# Patient Record
Sex: Male | Born: 1965 | Race: White | Hispanic: No | Smoking: Former smoker
Health system: Southern US, Community
[De-identification: ages and names within clinical notes are randomized; demographics above are authoritative.]

## PROBLEM LIST (undated history)

## (undated) DIAGNOSIS — R569 Unspecified convulsions: Secondary | ICD-10-CM

## (undated) DIAGNOSIS — I499 Cardiac arrhythmia, unspecified: Secondary | ICD-10-CM

## (undated) DIAGNOSIS — I10 Essential (primary) hypertension: Secondary | ICD-10-CM

## (undated) DIAGNOSIS — N400 Enlarged prostate without lower urinary tract symptoms: Secondary | ICD-10-CM

## (undated) DIAGNOSIS — M1990 Unspecified osteoarthritis, unspecified site: Secondary | ICD-10-CM

## (undated) DIAGNOSIS — E785 Hyperlipidemia, unspecified: Secondary | ICD-10-CM

## (undated) DIAGNOSIS — R0600 Dyspnea, unspecified: Secondary | ICD-10-CM

## (undated) DIAGNOSIS — K219 Gastro-esophageal reflux disease without esophagitis: Secondary | ICD-10-CM

## (undated) HISTORY — DX: Gastro-esophageal reflux disease without esophagitis: K21.9

## (undated) HISTORY — PX: CHOLECYSTECTOMY: SHX55

## (undated) HISTORY — DX: Unspecified convulsions: R56.9

## (undated) HISTORY — DX: Hyperlipidemia, unspecified: E78.5

## (undated) HISTORY — DX: Cardiac arrhythmia, unspecified: I49.9

## (undated) HISTORY — DX: Benign prostatic hyperplasia without lower urinary tract symptoms: N40.0

---

## 2010-11-26 DIAGNOSIS — R55 Syncope and collapse: Secondary | ICD-10-CM

## 2015-07-08 DIAGNOSIS — K210 Gastro-esophageal reflux disease with esophagitis: Secondary | ICD-10-CM | POA: Diagnosis not present

## 2015-07-08 DIAGNOSIS — G40111 Localization-related (focal) (partial) symptomatic epilepsy and epileptic syndromes with simple partial seizures, intractable, with status epilepticus: Secondary | ICD-10-CM | POA: Diagnosis not present

## 2015-07-08 DIAGNOSIS — I10 Essential (primary) hypertension: Secondary | ICD-10-CM | POA: Diagnosis not present

## 2015-07-08 DIAGNOSIS — Z6823 Body mass index (BMI) 23.0-23.9, adult: Secondary | ICD-10-CM | POA: Diagnosis not present

## 2015-07-09 DIAGNOSIS — I10 Essential (primary) hypertension: Secondary | ICD-10-CM | POA: Diagnosis not present

## 2015-07-09 DIAGNOSIS — Z125 Encounter for screening for malignant neoplasm of prostate: Secondary | ICD-10-CM | POA: Diagnosis not present

## 2015-10-06 DIAGNOSIS — Z1389 Encounter for screening for other disorder: Secondary | ICD-10-CM | POA: Diagnosis not present

## 2015-10-06 DIAGNOSIS — I10 Essential (primary) hypertension: Secondary | ICD-10-CM | POA: Diagnosis not present

## 2015-10-06 DIAGNOSIS — K210 Gastro-esophageal reflux disease with esophagitis: Secondary | ICD-10-CM | POA: Diagnosis not present

## 2015-10-06 DIAGNOSIS — G40111 Localization-related (focal) (partial) symptomatic epilepsy and epileptic syndromes with simple partial seizures, intractable, with status epilepticus: Secondary | ICD-10-CM | POA: Diagnosis not present

## 2015-10-06 DIAGNOSIS — N4289 Other specified disorders of prostate: Secondary | ICD-10-CM | POA: Diagnosis not present

## 2015-10-06 DIAGNOSIS — Z6822 Body mass index (BMI) 22.0-22.9, adult: Secondary | ICD-10-CM | POA: Diagnosis not present

## 2015-10-06 DIAGNOSIS — Z0000 Encounter for general adult medical examination without abnormal findings: Secondary | ICD-10-CM | POA: Diagnosis not present

## 2015-10-06 DIAGNOSIS — Z131 Encounter for screening for diabetes mellitus: Secondary | ICD-10-CM | POA: Diagnosis not present

## 2016-01-07 DIAGNOSIS — I10 Essential (primary) hypertension: Secondary | ICD-10-CM | POA: Diagnosis not present

## 2016-01-07 DIAGNOSIS — K210 Gastro-esophageal reflux disease with esophagitis: Secondary | ICD-10-CM | POA: Diagnosis not present

## 2016-01-07 DIAGNOSIS — Z6821 Body mass index (BMI) 21.0-21.9, adult: Secondary | ICD-10-CM | POA: Diagnosis not present

## 2016-01-07 DIAGNOSIS — Z0000 Encounter for general adult medical examination without abnormal findings: Secondary | ICD-10-CM | POA: Diagnosis not present

## 2016-04-09 DIAGNOSIS — I10 Essential (primary) hypertension: Secondary | ICD-10-CM | POA: Diagnosis not present

## 2016-04-09 DIAGNOSIS — Z6822 Body mass index (BMI) 22.0-22.9, adult: Secondary | ICD-10-CM | POA: Diagnosis not present

## 2016-04-09 DIAGNOSIS — K210 Gastro-esophageal reflux disease with esophagitis: Secondary | ICD-10-CM | POA: Diagnosis not present

## 2016-07-09 DIAGNOSIS — I10 Essential (primary) hypertension: Secondary | ICD-10-CM | POA: Diagnosis not present

## 2016-07-09 DIAGNOSIS — Z79899 Other long term (current) drug therapy: Secondary | ICD-10-CM | POA: Diagnosis not present

## 2016-07-09 DIAGNOSIS — W260XXA Contact with knife, initial encounter: Secondary | ICD-10-CM | POA: Diagnosis not present

## 2016-07-09 DIAGNOSIS — Z23 Encounter for immunization: Secondary | ICD-10-CM | POA: Diagnosis not present

## 2016-07-09 DIAGNOSIS — S61211A Laceration without foreign body of left index finger without damage to nail, initial encounter: Secondary | ICD-10-CM | POA: Diagnosis not present

## 2016-07-09 DIAGNOSIS — R569 Unspecified convulsions: Secondary | ICD-10-CM | POA: Diagnosis not present

## 2016-07-13 DIAGNOSIS — Z23 Encounter for immunization: Secondary | ICD-10-CM | POA: Diagnosis not present

## 2016-07-13 DIAGNOSIS — I10 Essential (primary) hypertension: Secondary | ICD-10-CM | POA: Diagnosis not present

## 2016-07-13 DIAGNOSIS — I472 Ventricular tachycardia: Secondary | ICD-10-CM | POA: Diagnosis not present

## 2016-07-13 DIAGNOSIS — R079 Chest pain, unspecified: Secondary | ICD-10-CM | POA: Diagnosis not present

## 2016-07-13 DIAGNOSIS — G40909 Epilepsy, unspecified, not intractable, without status epilepticus: Secondary | ICD-10-CM | POA: Diagnosis not present

## 2016-07-13 DIAGNOSIS — S199XXA Unspecified injury of neck, initial encounter: Secondary | ICD-10-CM | POA: Diagnosis not present

## 2016-07-13 DIAGNOSIS — S40011A Contusion of right shoulder, initial encounter: Secondary | ICD-10-CM | POA: Diagnosis not present

## 2016-07-13 DIAGNOSIS — K219 Gastro-esophageal reflux disease without esophagitis: Secondary | ICD-10-CM | POA: Diagnosis not present

## 2016-07-13 DIAGNOSIS — S4991XA Unspecified injury of right shoulder and upper arm, initial encounter: Secondary | ICD-10-CM | POA: Diagnosis not present

## 2016-07-13 DIAGNOSIS — S139XXA Sprain of joints and ligaments of unspecified parts of neck, initial encounter: Secondary | ICD-10-CM | POA: Diagnosis not present

## 2016-07-13 DIAGNOSIS — R55 Syncope and collapse: Secondary | ICD-10-CM | POA: Diagnosis not present

## 2016-07-13 DIAGNOSIS — S138XXA Sprain of joints and ligaments of other parts of neck, initial encounter: Secondary | ICD-10-CM | POA: Diagnosis not present

## 2016-07-13 DIAGNOSIS — Z79899 Other long term (current) drug therapy: Secondary | ICD-10-CM | POA: Diagnosis not present

## 2016-07-13 DIAGNOSIS — W1839XA Other fall on same level, initial encounter: Secondary | ICD-10-CM | POA: Diagnosis not present

## 2016-07-14 DIAGNOSIS — Z23 Encounter for immunization: Secondary | ICD-10-CM | POA: Diagnosis not present

## 2016-07-14 DIAGNOSIS — R55 Syncope and collapse: Secondary | ICD-10-CM | POA: Diagnosis not present

## 2016-07-14 DIAGNOSIS — S139XXA Sprain of joints and ligaments of unspecified parts of neck, initial encounter: Secondary | ICD-10-CM | POA: Diagnosis not present

## 2016-07-14 DIAGNOSIS — G40909 Epilepsy, unspecified, not intractable, without status epilepticus: Secondary | ICD-10-CM | POA: Diagnosis not present

## 2016-07-14 DIAGNOSIS — I10 Essential (primary) hypertension: Secondary | ICD-10-CM | POA: Diagnosis not present

## 2016-07-14 DIAGNOSIS — I472 Ventricular tachycardia: Secondary | ICD-10-CM | POA: Diagnosis not present

## 2016-07-14 DIAGNOSIS — S40011A Contusion of right shoulder, initial encounter: Secondary | ICD-10-CM | POA: Diagnosis not present

## 2016-07-14 DIAGNOSIS — Z79899 Other long term (current) drug therapy: Secondary | ICD-10-CM | POA: Diagnosis not present

## 2016-07-14 DIAGNOSIS — W1839XA Other fall on same level, initial encounter: Secondary | ICD-10-CM | POA: Diagnosis not present

## 2016-07-14 DIAGNOSIS — K219 Gastro-esophageal reflux disease without esophagitis: Secondary | ICD-10-CM | POA: Diagnosis not present

## 2016-07-14 DIAGNOSIS — S138XXA Sprain of joints and ligaments of other parts of neck, initial encounter: Secondary | ICD-10-CM | POA: Diagnosis not present

## 2016-07-15 DIAGNOSIS — R55 Syncope and collapse: Secondary | ICD-10-CM | POA: Diagnosis not present

## 2016-07-15 DIAGNOSIS — S138XXA Sprain of joints and ligaments of other parts of neck, initial encounter: Secondary | ICD-10-CM | POA: Diagnosis not present

## 2016-07-15 DIAGNOSIS — G40909 Epilepsy, unspecified, not intractable, without status epilepticus: Secondary | ICD-10-CM | POA: Diagnosis not present

## 2016-07-15 DIAGNOSIS — S139XXA Sprain of joints and ligaments of unspecified parts of neck, initial encounter: Secondary | ICD-10-CM | POA: Diagnosis not present

## 2016-07-15 DIAGNOSIS — S40011A Contusion of right shoulder, initial encounter: Secondary | ICD-10-CM | POA: Diagnosis not present

## 2016-07-15 DIAGNOSIS — K219 Gastro-esophageal reflux disease without esophagitis: Secondary | ICD-10-CM | POA: Diagnosis not present

## 2016-07-15 DIAGNOSIS — W1839XA Other fall on same level, initial encounter: Secondary | ICD-10-CM | POA: Diagnosis not present

## 2016-07-15 DIAGNOSIS — I472 Ventricular tachycardia: Secondary | ICD-10-CM | POA: Diagnosis not present

## 2016-07-15 DIAGNOSIS — Z23 Encounter for immunization: Secondary | ICD-10-CM | POA: Diagnosis not present

## 2016-07-15 DIAGNOSIS — Z79899 Other long term (current) drug therapy: Secondary | ICD-10-CM | POA: Diagnosis not present

## 2016-07-15 DIAGNOSIS — I10 Essential (primary) hypertension: Secondary | ICD-10-CM | POA: Diagnosis not present

## 2016-07-16 DIAGNOSIS — S61211D Laceration without foreign body of left index finger without damage to nail, subsequent encounter: Secondary | ICD-10-CM | POA: Diagnosis not present

## 2016-07-16 DIAGNOSIS — Z4802 Encounter for removal of sutures: Secondary | ICD-10-CM | POA: Diagnosis not present

## 2016-07-22 DIAGNOSIS — I472 Ventricular tachycardia: Secondary | ICD-10-CM | POA: Diagnosis not present

## 2016-07-22 DIAGNOSIS — R55 Syncope and collapse: Secondary | ICD-10-CM | POA: Diagnosis not present

## 2016-07-22 DIAGNOSIS — Z6823 Body mass index (BMI) 23.0-23.9, adult: Secondary | ICD-10-CM | POA: Diagnosis not present

## 2016-07-22 DIAGNOSIS — K210 Gastro-esophageal reflux disease with esophagitis: Secondary | ICD-10-CM | POA: Diagnosis not present

## 2016-07-22 DIAGNOSIS — Z125 Encounter for screening for malignant neoplasm of prostate: Secondary | ICD-10-CM | POA: Diagnosis not present

## 2016-07-22 DIAGNOSIS — N400 Enlarged prostate without lower urinary tract symptoms: Secondary | ICD-10-CM | POA: Diagnosis not present

## 2016-07-22 DIAGNOSIS — I10 Essential (primary) hypertension: Secondary | ICD-10-CM | POA: Diagnosis not present

## 2016-08-06 ENCOUNTER — Ambulatory Visit (INDEPENDENT_AMBULATORY_CARE_PROVIDER_SITE_OTHER): Payer: Self-pay | Admitting: Cardiovascular Disease

## 2016-08-06 ENCOUNTER — Encounter: Payer: Self-pay | Admitting: Cardiovascular Disease

## 2016-08-06 ENCOUNTER — Encounter: Payer: Self-pay | Admitting: *Deleted

## 2016-08-06 VITALS — BP 124/86 | HR 71 | Ht 73.0 in | Wt 178.4 lb

## 2016-08-06 DIAGNOSIS — I4729 Other ventricular tachycardia: Secondary | ICD-10-CM

## 2016-08-06 DIAGNOSIS — Z9289 Personal history of other medical treatment: Secondary | ICD-10-CM

## 2016-08-06 DIAGNOSIS — I10 Essential (primary) hypertension: Secondary | ICD-10-CM

## 2016-08-06 DIAGNOSIS — I472 Ventricular tachycardia: Secondary | ICD-10-CM

## 2016-08-06 DIAGNOSIS — R55 Syncope and collapse: Secondary | ICD-10-CM

## 2016-08-06 NOTE — Progress Notes (Signed)
CARDIOLOGY CONSULT NOTE  Patient ID: Timothy Tyler. MRN: [id number] DOB/AGE: 1966/03/02 50 y.o.  Admit date: (Not on file) Primary Physician: Toma Deiters, MD Referring Physician:   Reason for Consultation: Syncope  HPI: The patient is a 51 year old male who was recently hospitalized for syncope at [facility]. He reportedly had a run of ventricular tachycardia which was self terminated and it was felt his syncope was due to arrhythmia. The run of ventricular tachycardia was reportedly 5 beats, nonsustained. He was given IV fluids. I reviewed all labs, studies, and documentation pertaining to this hospitalization. When he presented to the hospital on 07/13/16, he reportedly passed out while getting out of the bed at 3:30 in the morning and found himself on the floor and lost consciousness for an unknown period of time. An echocardiogram was performed and reportedly demonstrated normal left ventricular systolic and diastolic function, EF 55-60%. Potassium, white blood cell count, hemoglobin, BUN, and creatinine were all normal.  He said he passed out for 30 minutes. He denies antecedent chest pain, shortness of breath, and palpitations. He had some nausea before he lost consciousness. Afterwards he had right shoulder and neck pain. He occasionally has mild exertional dyspnea. He has not passed out before.    No Known Allergies  Current Outpatient Prescriptions  Medication Sig Dispense Refill  . benazepril-hydrochlorthiazide (LOTENSIN HCT) 20-12.5 MG tablet Take 1 tablet by mouth daily.    . carbamazepine (TEGRETOL) 200 MG tablet Take 200 mg by mouth 2 (two) times daily.    . metoprolol tartrate (LOPRESSOR) 25 MG tablet Take 25 mg by mouth 2 (two) times daily.    . Omega-3 Fatty Acids (FISH OIL) 1000 MG CAPS Take 1,000 mg by mouth 3 (three) times daily as needed.    . ranitidine (ZANTAC) 300 MG tablet Take 300 mg by mouth 2 (two) times daily.     No current  facility-administered medications for this visit.     No past medical history on file.  No past surgical history on file.  Social History   Social History  . Marital status: Divorced    Spouse name: N/A  . Number of children: N/A  . Years of education: N/A   Occupational History  . Not on file.   Social History Main Topics  . Smoking status: Former Smoker    Types: Cigarettes    Quit date: 2003  . Smokeless tobacco: Never Used  . Alcohol use Not on file  . Drug use: Unknown  . Sexual activity: Not on file   Other Topics Concern  . Not on file   Social History Narrative  . No narrative on file     No family history of premature CAD in 1st degree relatives.  Prior to Admission medications   Medication Sig Start Date End Date Taking? Authorizing Provider  benazepril-hydrochlorthiazide (LOTENSIN HCT) 20-12.5 MG tablet Take 1 tablet by mouth daily.    Historical Provider, MD  carbamazepine (TEGRETOL) 200 MG tablet Take 200 mg by mouth 2 (two) times daily.    Historical Provider, MD  desmopressin (DDAVP) 0.2 MG tablet Take 0.2 mg by mouth at bedtime.    Historical Provider, MD  esomeprazole (NEXIUM) 40 MG capsule Take 40 mg by mouth daily at 12 noon.    Historical Provider, MD  metoprolol tartrate (LOPRESSOR) 25 MG tablet Take 25 mg by mouth 2 (two) times daily.    Historical Provider, MD  Omega-3 Fatty Acids (FISH OIL)  1000 MG CAPS Take 1,000 mg by mouth 3 (three) times daily as needed.    Historical Provider, MD  ranitidine (ZANTAC) 300 MG tablet Take 300 mg by mouth 2 (two) times daily.    Historical Provider, MD  tadalafil (CIALIS) 5 MG tablet Take 5 mg by mouth daily as needed for erectile dysfunction.    Historical Provider, MD     Review of systems complete and found to be negative unless listed above in HPI     Physical exam Blood pressure 130/84, pulse 74, height 6\' 1"  (1.854 m), weight 178 lb 6.4 oz (80.9 kg), SpO2 99 %. General: NAD Neck: No JVD, no  thyromegaly or thyroid nodule.  Lungs: Clear to auscultation bilaterally with normal respiratory effort. CV: Nondisplaced PMI. Regular rate and rhythm, normal S1/S2, no S3/S4, no murmur.  No peripheral edema.  No carotid bruit.    Abdomen: Soft, nontender, no hepatosplenomegaly, no distention.  Skin: Intact without lesions or rashes.  Neurologic: Alert and oriented x 3.  Psych: Normal affect. Extremities: No clubbing or cyanosis.  HEENT: Normal.   ECG: Most recent ECG reviewed.  Labs:  No results found for: WBC, HGB, HCT, MCV, PLT No results for input(s): NA, K, CL, CO2, BUN, CREATININE, CALCIUM, PROT, BILITOT, ALKPHOS, ALT, AST, GLUCOSE in the last 168 hours.  Invalid input(s): LABALBU No results found for: CKTOTAL, CKMB, CKMBINDEX, TROPONINI No results found for: CHOL No results found for: HDL No results found for: LDLCALC No results found for: TRIG No results found for: CHOLHDL No results found for: LDLDIRECT       Studies: No results found.  ASSESSMENT AND PLAN:  1. Syncope: It is unclear to me if this was cardiac mediated. Only a 5 beat run of nonsustained ventricular tachycardia was documented and I do not have the rhythm strips. I will obtain a nuclear stress test and a 30 day event monitor to evaluate for an ischemic and an arrhythmic etiology, respectively. Instructed not to drive.  2. Hypertension: Controlled. No changes to therapy.  3. NSVT: See #1. 30-day event monitor to evaluate for syncope. Nuclear stress test to see if ischemic in etiology.   Dispo: fu 2 months.   Signed: Prentice DockerSuresh Veryl Abril, M.D., F.A.C.C.  08/06/2016, 8:49 AM

## 2016-08-06 NOTE — Patient Instructions (Signed)
Your physician recommends that you schedule a follow-up appointment in: 2 MONTHS WITH DR. Purvis SheffieldKONESWARAN  Your physician recommends that you continue on your current medications as directed. Please refer to the Current Medication list given to you today.  Your physician has requested that you have en exercise stress myoview. For further information please visit [email or web address]. Please follow instruction sheet, as given.  Your physician has recommended that you wear an event monitor FOR 30 DAYS. Event monitors are medical devices that record the heart's electrical activity. Doctors most often us these monitors to diagnose arrhythmias. Arrhythmias are problems with the speed or rhythm of the heartbeat. The monitor is a small, portable device. You can wear one while you do your normal daily activities. This is usually used to diagnose what is causing palpitations/syncope (passing out).    Thank you for choosing [facility]!!

## 2016-08-10 DIAGNOSIS — K5900 Constipation, unspecified: Secondary | ICD-10-CM | POA: Diagnosis not present

## 2016-08-10 DIAGNOSIS — Z79899 Other long term (current) drug therapy: Secondary | ICD-10-CM | POA: Diagnosis not present

## 2016-08-10 DIAGNOSIS — R109 Unspecified abdominal pain: Secondary | ICD-10-CM | POA: Diagnosis not present

## 2016-08-10 DIAGNOSIS — I10 Essential (primary) hypertension: Secondary | ICD-10-CM | POA: Diagnosis not present

## 2016-08-10 DIAGNOSIS — M545 Low back pain: Secondary | ICD-10-CM | POA: Diagnosis not present

## 2016-08-10 DIAGNOSIS — R569 Unspecified convulsions: Secondary | ICD-10-CM | POA: Diagnosis not present

## 2016-08-12 ENCOUNTER — Inpatient Hospital Stay (HOSPITAL_COMMUNITY): Admission: RE | Admit: 2016-08-12 | Payer: Self-pay | Source: Ambulatory Visit

## 2016-08-12 ENCOUNTER — Encounter (HOSPITAL_COMMUNITY)
Admission: RE | Admit: 2016-08-12 | Discharge: 2016-08-12 | Disposition: A | Discharge status: Home / Self Care | Payer: Medicare HMO | Source: Ambulatory Visit | Attending: Cardiovascular Disease | Admitting: Cardiovascular Disease

## 2016-08-12 ENCOUNTER — Encounter (HOSPITAL_COMMUNITY): Payer: Self-pay

## 2016-08-12 DIAGNOSIS — R55 Syncope and collapse: Secondary | ICD-10-CM

## 2016-08-12 HISTORY — DX: Essential (primary) hypertension: I10

## 2016-08-12 LAB — NM MYOCAR MULTI W/SPECT W/WALL MOTION / EF
CHL CUP NUCLEAR SDS: 0
CHL CUP NUCLEAR SRS: 0
CHL CUP RESTING HR STRESS: 61 {beats}/min
CSEPEW: 10.1 METS
CSEPPHR: 146 {beats}/min
Exercise duration (min): 9 min
Exercise duration (sec): 20 s
LHR: 0.36
LV dias vol: 83 mL (ref 62–150)
LVSYSVOL: 27 mL
MPHR: 170 {beats}/min
Percent HR: 85 %
RPE: 13
SSS: 0
TID: 0.93

## 2016-08-12 MED ORDER — TECHNETIUM TC 99M TETROFOSMIN IV KIT
30.0000 | PACK | Freq: Once | INTRAVENOUS | Status: AC | PRN
  Administered 2016-08-12: 30.6 via INTRAVENOUS

## 2016-08-12 MED ORDER — REGADENOSON 0.4 MG/5ML IV SOLN
INTRAVENOUS | Status: AC
  Filled 2016-08-12: qty 5

## 2016-08-12 MED ORDER — TECHNETIUM TC 99M TETROFOSMIN IV KIT
10.0000 | PACK | Freq: Once | INTRAVENOUS | Status: AC | PRN
  Administered 2016-08-12: 10.4 via INTRAVENOUS

## 2016-08-12 MED ORDER — SODIUM CHLORIDE 0.9% FLUSH
INTRAVENOUS | Status: AC
  Administered 2016-08-12: 10 mL via INTRAVENOUS
  Filled 2016-08-12: qty 10

## 2016-08-16 ENCOUNTER — Telehealth: Payer: Self-pay | Admitting: *Deleted

## 2016-08-16 NOTE — Telephone Encounter (Signed)
Notes Recorded by Lesle ChrisAngela G Hill, LPN on 0/98/11912/06/2017 at 1:05 PM EST Patient notified and verbalized understanding. Copy to pmd. ------  Notes Recorded by Laqueta LindenSuresh A Koneswaran, MD on 08/16/2016 at 10:04 AM EST Normal.

## 2016-08-20 ENCOUNTER — Ambulatory Visit (INDEPENDENT_AMBULATORY_CARE_PROVIDER_SITE_OTHER): Payer: Medicare HMO

## 2016-08-20 DIAGNOSIS — R55 Syncope and collapse: Secondary | ICD-10-CM | POA: Diagnosis not present

## 2016-10-07 ENCOUNTER — Ambulatory Visit (INDEPENDENT_AMBULATORY_CARE_PROVIDER_SITE_OTHER): Payer: Medicare HMO | Admitting: Cardiovascular Disease

## 2016-10-07 ENCOUNTER — Encounter: Payer: Self-pay | Admitting: Cardiovascular Disease

## 2016-10-07 VITALS — BP 102/84 | HR 59 | Ht 73.0 in | Wt 172.0 lb

## 2016-10-07 DIAGNOSIS — Z136 Encounter for screening for cardiovascular disorders: Secondary | ICD-10-CM

## 2016-10-07 DIAGNOSIS — I472 Ventricular tachycardia: Secondary | ICD-10-CM

## 2016-10-07 DIAGNOSIS — IMO0001 Reserved for inherently not codable concepts without codable children: Secondary | ICD-10-CM

## 2016-10-07 DIAGNOSIS — I4729 Other ventricular tachycardia: Secondary | ICD-10-CM

## 2016-10-07 DIAGNOSIS — I10 Essential (primary) hypertension: Secondary | ICD-10-CM | POA: Diagnosis not present

## 2016-10-07 DIAGNOSIS — R55 Syncope and collapse: Secondary | ICD-10-CM | POA: Diagnosis not present

## 2016-10-07 NOTE — Progress Notes (Signed)
      SUBJECTIVE: The patient returns for follow-up after undergoing cardiovascular testing performed for the evaluation of syncope and NSVT.  Nuclear stress test 08/12/16 was a low risk study with no evidence of ischemia. He had a low risk Duke treadmill score of 9.5. There were no arrhythmias.  He has had no recurrences of syncope. He denies palpitations. He has chest pain when lifting heavy objects a certain way and associated right sided neck pain.    Review of Systems: As per "subjective", otherwise negative.  No Known Allergies  Current Outpatient Prescriptions  Medication Sig Dispense Refill  . benazepril-hydrochlorthiazide (LOTENSIN HCT) 20-12.5 MG tablet Take 1 tablet by mouth daily.    . carbamazepine (TEGRETOL) 200 MG tablet Take 200 mg by mouth 2 (two) times daily.    . metoprolol tartrate (LOPRESSOR) 25 MG tablet Take 25 mg by mouth 2 (two) times daily.    . Omega-3 Fatty Acids (FISH OIL) 1000 MG CAPS Take 1,000 mg by mouth 3 (three) times daily as needed.     No current facility-administered medications for this visit.     Past Medical History:  Diagnosis Date  . Hypertension     No past surgical history on file.  Social History   Social History  . Marital status: Divorced    Spouse name: N/A  . Number of children: N/A  . Years of education: N/A   Occupational History  . Not on file.   Social History Main Topics  . Smoking status: Former Smoker    Types: Cigarettes    Quit date: 2003  . Smokeless tobacco: Never Used  . Alcohol use Not on file  . Drug use: Unknown  . Sexual activity: Not on file   Other Topics Concern  . Not on file   Social History Narrative  . No narrative on file     Vitals:   10/07/16 0959  BP: 102/84  Pulse: (!) 59  SpO2: 98%  Weight: 172 lb (78 kg)  Height:  (1.854 m)    PHYSICAL EXAM General: NAD HEENT: Normal. Neck: No JVD, no thyromegaly. Lungs: Clear to auscultation bilaterally with normal respiratory  effort. CV: Nondisplaced PMI.  Regular rate and rhythm, normal S1/S2, no S3/S4, no murmur. No pretibial or periankle edema.   Abdomen: Soft, nontender, no distention.  Neurologic: Alert and oriented.  Psych: Normal affect. Skin: Normal. Musculoskeletal: No gross deformities.    ECG: Most recent ECG reviewed.      ASSESSMENT AND PLAN: 1. Syncope: No ischemia by stress testing as detailed above. No recurrences. Awaiting event monitor results.  2. Hypertension: Controlled on present therapy. No changes.  3. NSVT: No ischemia by stress testing.Continue metoprolol.  Dispo: fu 3 months.   Prentice Docker, M.D., F.A.C.C.

## 2016-10-07 NOTE — Patient Instructions (Signed)
Medication Instructions:  Continue all current medications.  Labwork: none  Testing/Procedures: none  Follow-Up: 3 months   Any Other Special Instructions Will Be Listed Below (If Applicable).  If you need a refill on your cardiac medications before your next appointment, please call your pharmacy.  

## 2016-10-15 ENCOUNTER — Telehealth: Payer: Self-pay | Admitting: *Deleted

## 2016-10-15 NOTE — Telephone Encounter (Signed)
Notes recorded by Lesle Chris, LPN on 1/61/0960 at 2:44 PM EDT Patient notified. Copy to pmd. Follow up scheduled for 01/17/2017 with Dr. Purvis Sheffield. ------  Notes recorded by Lesle Chris, LPN on 4/54/0981 at 9:02 AM EDT No answer.  ------  Notes recorded by Laqueta Linden, MD on 10/12/2016 at 3:38 PM EDT Normal sinus rhythm. No significant arrhythmias.

## 2017-01-17 ENCOUNTER — Ambulatory Visit (INDEPENDENT_AMBULATORY_CARE_PROVIDER_SITE_OTHER): Payer: Medicare HMO | Admitting: Cardiovascular Disease

## 2017-01-17 ENCOUNTER — Encounter: Payer: Self-pay | Admitting: Cardiovascular Disease

## 2017-01-17 VITALS — BP 118/90 | HR 74 | Ht 73.0 in | Wt 174.0 lb

## 2017-01-17 DIAGNOSIS — Z136 Encounter for screening for cardiovascular disorders: Secondary | ICD-10-CM

## 2017-01-17 DIAGNOSIS — I4729 Other ventricular tachycardia: Secondary | ICD-10-CM

## 2017-01-17 DIAGNOSIS — I472 Ventricular tachycardia: Secondary | ICD-10-CM

## 2017-01-17 DIAGNOSIS — R55 Syncope and collapse: Secondary | ICD-10-CM

## 2017-01-17 DIAGNOSIS — I10 Essential (primary) hypertension: Secondary | ICD-10-CM

## 2017-01-17 DIAGNOSIS — IMO0001 Reserved for inherently not codable concepts without codable children: Secondary | ICD-10-CM

## 2017-01-17 NOTE — Progress Notes (Signed)
      SUBJECTIVE: The patient presents for follow-up of nonsustained ventricular tachycardia and syncope.  Nuclear stress test 08/12/16 was a low risk study with no evidence of ischemia. He had a low risk Duke treadmill score of 9.5. There were no arrhythmias.  Event monitoring demonstrated normal sinus rhythm with no significant arrhythmias.  He is feeling well. He is now on Zantac for GERD. He denies syncope. He seldom has shortness of breath. He denies palpitations.   Review of Systems: As per "subjective", otherwise negative.  No Known Allergies  Current Outpatient Prescriptions  Medication Sig Dispense Refill  . benazepril-hydrochlorthiazide (LOTENSIN HCT) 20-12.5 MG tablet Take 1 tablet by mouth daily.    . carbamazepine (TEGRETOL) 200 MG tablet Take 200 mg by mouth 2 (two) times daily.    . metoprolol tartrate (LOPRESSOR) 25 MG tablet Take 25 mg by mouth 2 (two) times daily.    . Omega-3 Fatty Acids (FISH OIL) 1000 MG CAPS Take 1,000 mg by mouth 3 (three) times daily as needed.    . RaNITidine HCl (ZANTAC PO) Take by mouth.     No current facility-administered medications for this visit.     Past Medical History:  Diagnosis Date  . Hypertension     No past surgical history on file.  Social History   Social History  . Marital status: Divorced    Spouse name: N/A  . Number of children: N/A  . Years of education: N/A   Occupational History  . Not on file.   Social History Main Topics  . Smoking status: Former Smoker    Types: Cigarettes    Quit date: 2003  . Smokeless tobacco: Never Used  . Alcohol use Not on file  . Drug use: Unknown  . Sexual activity: Not on file   Other Topics Concern  . Not on file   Social History Narrative  . No narrative on file     Vitals:   01/17/17 1253  BP: 118/90  Pulse: 74  SpO2: 98%  Weight: 174 lb (78.9 kg)  Height: 6\' 1"  (1.854 m)    Wt Readings from Last 3 Encounters:  01/17/17 174 lb (78.9 kg)  10/07/16  172 lb (78 kg)  08/06/16 178 lb 6.4 oz (80.9 kg)     PHYSICAL EXAM General: NAD HEENT: Normal. Neck: No JVD, no thyromegaly. Lungs: Clear to auscultation bilaterally with normal respiratory effort. CV: Nondisplaced PMI.  Regular rate and rhythm, normal S1/S2, no S3/S4, no murmur. No pretibial or periankle edema.  No carotid bruit.   Abdomen: Soft, nontender, no distention.  Neurologic: Alert and oriented.  Psych: Normal affect. Skin: Normal. Musculoskeletal: No gross deformities.    ECG: Most recent ECG reviewed.   Labs: No results found for: K, BUN, CREATININE, ALT, TSH, HGB   Lipids: No results found for: LDLCALC, LDLDIRECT, CHOL, TRIG, HDL     ASSESSMENT AND PLAN:  1. Syncope: No ischemia by stress testing as detailed above. No recurrences. Event monitoring demonstrated normal sinus rhythm with no significant arrhythmias. I will wean him off metoprolol within the next 3 days.  2. Hypertension:Mildly elevated DBP. Needs continued monitoring.  3. NSVT: No ischemia by stress testing.No recurrences by event monitoring. I will wean him off metoprolol within the next 3 days as this was an isolated 5 beat run.      Disposition: Follow up prn   Prentice DockerSuresh Pennelope Basque, M.D., F.A.C.C.

## 2017-01-17 NOTE — Patient Instructions (Addendum)
Medication Instructions:   Decrease Metoprolol tart to 25mg  daily x 3 days, then STOP.  Continue all other medications.    Labwork: none  Testing/Procedures: none  Follow-Up: As needed   Any Other Special Instructions Will Be Listed Below (If Applicable).  If you need a refill on your cardiac medications before your next appointment, please call your pharmacy.

## 2017-01-20 ENCOUNTER — Telehealth: Payer: Self-pay | Admitting: Cardiovascular Disease

## 2017-01-20 NOTE — Telephone Encounter (Signed)
Patient verbalized understanding  

## 2017-01-20 NOTE — Telephone Encounter (Signed)
He has no significant arrhythmias and a normal stress test, thus no indication to be on beta blockers. I explained to him the symptoms he may experience when weaning off metoprolol. I would have him follow up with PCP.

## 2017-01-20 NOTE — Telephone Encounter (Signed)
Patient states he is having some tightness of chest currently but very very mild (1/10) Patient was told at last office visit to go off metoprolol 25 mg BID to only taking it once daily after 3 days to stop. Patient states he has shortness of breath on exertion. Patient states last night the pain in his chest was a 7/10 and was very short of breath. Patient thinks decrease in Metoprolol is causing this and wants to continue with twice daily. Patient was advised to go to the ER if symptoms get worse.

## 2017-01-20 NOTE — Telephone Encounter (Signed)
Patient called stating that he was walking last night and started having tightness in chest with shortness of breath. Wants to know if he is suppose to continue taking his heart medications.    Please call [phone].

## 2017-12-06 DIAGNOSIS — J069 Acute upper respiratory infection, unspecified: Secondary | ICD-10-CM | POA: Diagnosis not present

## 2017-12-06 DIAGNOSIS — Z299 Encounter for prophylactic measures, unspecified: Secondary | ICD-10-CM | POA: Diagnosis not present

## 2017-12-06 DIAGNOSIS — Z6822 Body mass index (BMI) 22.0-22.9, adult: Secondary | ICD-10-CM | POA: Diagnosis not present

## 2017-12-06 DIAGNOSIS — G40909 Epilepsy, unspecified, not intractable, without status epilepticus: Secondary | ICD-10-CM | POA: Diagnosis not present

## 2017-12-06 DIAGNOSIS — I10 Essential (primary) hypertension: Secondary | ICD-10-CM | POA: Diagnosis not present

## 2018-04-01 IMAGING — NM NM MYOCAR MULTI W/SPECT W/WALL MOTION & EF
2 series · 12 of 12 positions shown · non-contrast
Comparison: none

[Series 1: rest · 8.28mm/px · 6 of 64 frames shown]
[frame 6/64]
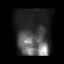
[frame 16/64]
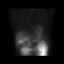
[frame 27/64]
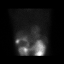
[frame 38/64]
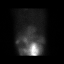
[frame 48/64]
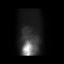
[frame 59/64]
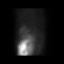

[Series 2: stress gated · 8.28mm/px · 6 of 64 frames shown]
[frame 6/64]
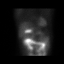
[frame 16/64]
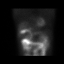
[frame 27/64]
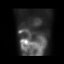
[frame 38/64]
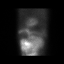
[frame 48/64]
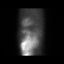
[frame 59/64]
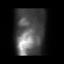

[12 of 12 positions shown; findings below may reference images not displayed]

Canned report from images found in remote index.

Refer to host system for actual result text.

## 2018-05-04 DIAGNOSIS — Z6823 Body mass index (BMI) 23.0-23.9, adult: Secondary | ICD-10-CM | POA: Diagnosis not present

## 2018-05-04 DIAGNOSIS — Z299 Encounter for prophylactic measures, unspecified: Secondary | ICD-10-CM | POA: Diagnosis not present

## 2018-05-04 DIAGNOSIS — M545 Low back pain: Secondary | ICD-10-CM | POA: Diagnosis not present

## 2018-05-04 DIAGNOSIS — I10 Essential (primary) hypertension: Secondary | ICD-10-CM | POA: Diagnosis not present

## 2018-05-04 DIAGNOSIS — M62830 Muscle spasm of back: Secondary | ICD-10-CM | POA: Diagnosis not present

## 2018-05-04 DIAGNOSIS — G40909 Epilepsy, unspecified, not intractable, without status epilepticus: Secondary | ICD-10-CM | POA: Diagnosis not present

## 2018-08-24 DIAGNOSIS — H16223 Keratoconjunctivitis sicca, not specified as Sjogren's, bilateral: Secondary | ICD-10-CM | POA: Diagnosis not present

## 2018-08-24 DIAGNOSIS — H5200 Hypermetropia, unspecified eye: Secondary | ICD-10-CM | POA: Diagnosis not present

## 2019-07-11 ENCOUNTER — Other Ambulatory Visit: Payer: Self-pay

## 2019-07-11 ENCOUNTER — Ambulatory Visit: Payer: Medicare Other | Attending: Internal Medicine

## 2019-07-11 DIAGNOSIS — Z20822 Contact with and (suspected) exposure to covid-19: Secondary | ICD-10-CM

## 2019-07-13 ENCOUNTER — Telehealth: Payer: Self-pay | Admitting: *Deleted

## 2019-07-13 LAB — NOVEL CORONAVIRUS, NAA: SARS-CoV-2, NAA: NOT DETECTED

## 2019-07-13 NOTE — Telephone Encounter (Signed)
Pt given result of COVID test from 07/11/19; he verbalized understanding.

## 2019-07-19 DIAGNOSIS — I10 Essential (primary) hypertension: Secondary | ICD-10-CM | POA: Diagnosis not present

## 2019-07-19 DIAGNOSIS — Z6823 Body mass index (BMI) 23.0-23.9, adult: Secondary | ICD-10-CM | POA: Diagnosis not present

## 2019-07-19 DIAGNOSIS — Z299 Encounter for prophylactic measures, unspecified: Secondary | ICD-10-CM | POA: Diagnosis not present

## 2019-07-19 DIAGNOSIS — H6123 Impacted cerumen, bilateral: Secondary | ICD-10-CM | POA: Diagnosis not present

## 2019-08-29 DIAGNOSIS — K649 Unspecified hemorrhoids: Secondary | ICD-10-CM | POA: Diagnosis not present

## 2019-08-29 DIAGNOSIS — Z299 Encounter for prophylactic measures, unspecified: Secondary | ICD-10-CM | POA: Diagnosis not present

## 2019-08-29 DIAGNOSIS — Z6824 Body mass index (BMI) 24.0-24.9, adult: Secondary | ICD-10-CM | POA: Diagnosis not present

## 2019-08-29 DIAGNOSIS — I10 Essential (primary) hypertension: Secondary | ICD-10-CM | POA: Diagnosis not present

## 2019-08-29 DIAGNOSIS — Z713 Dietary counseling and surveillance: Secondary | ICD-10-CM | POA: Diagnosis not present

## 2019-09-25 DIAGNOSIS — J069 Acute upper respiratory infection, unspecified: Secondary | ICD-10-CM | POA: Diagnosis not present

## 2019-09-25 DIAGNOSIS — I10 Essential (primary) hypertension: Secondary | ICD-10-CM | POA: Diagnosis not present

## 2019-09-25 DIAGNOSIS — Z299 Encounter for prophylactic measures, unspecified: Secondary | ICD-10-CM | POA: Diagnosis not present

## 2019-09-25 DIAGNOSIS — J029 Acute pharyngitis, unspecified: Secondary | ICD-10-CM | POA: Diagnosis not present

## 2019-12-06 DIAGNOSIS — J029 Acute pharyngitis, unspecified: Secondary | ICD-10-CM | POA: Diagnosis not present

## 2019-12-06 DIAGNOSIS — R5383 Other fatigue: Secondary | ICD-10-CM | POA: Diagnosis not present

## 2019-12-06 DIAGNOSIS — Z299 Encounter for prophylactic measures, unspecified: Secondary | ICD-10-CM | POA: Diagnosis not present

## 2019-12-12 DIAGNOSIS — Z7189 Other specified counseling: Secondary | ICD-10-CM | POA: Diagnosis not present

## 2019-12-12 DIAGNOSIS — Z0000 Encounter for general adult medical examination without abnormal findings: Secondary | ICD-10-CM | POA: Diagnosis not present

## 2019-12-12 DIAGNOSIS — Z299 Encounter for prophylactic measures, unspecified: Secondary | ICD-10-CM | POA: Diagnosis not present

## 2019-12-12 DIAGNOSIS — L98499 Non-pressure chronic ulcer of skin of other sites with unspecified severity: Secondary | ICD-10-CM | POA: Diagnosis not present

## 2019-12-12 DIAGNOSIS — I10 Essential (primary) hypertension: Secondary | ICD-10-CM | POA: Diagnosis not present

## 2019-12-12 DIAGNOSIS — E7800 Pure hypercholesterolemia, unspecified: Secondary | ICD-10-CM | POA: Diagnosis not present

## 2019-12-12 DIAGNOSIS — Z79899 Other long term (current) drug therapy: Secondary | ICD-10-CM | POA: Diagnosis not present

## 2019-12-12 DIAGNOSIS — R5383 Other fatigue: Secondary | ICD-10-CM | POA: Diagnosis not present

## 2019-12-12 DIAGNOSIS — Z1211 Encounter for screening for malignant neoplasm of colon: Secondary | ICD-10-CM | POA: Diagnosis not present

## 2020-03-04 DIAGNOSIS — I10 Essential (primary) hypertension: Secondary | ICD-10-CM | POA: Diagnosis not present

## 2020-03-04 DIAGNOSIS — E7849 Other hyperlipidemia: Secondary | ICD-10-CM | POA: Diagnosis not present

## 2020-03-04 DIAGNOSIS — K219 Gastro-esophageal reflux disease without esophagitis: Secondary | ICD-10-CM | POA: Diagnosis not present

## 2020-04-03 DIAGNOSIS — E7849 Other hyperlipidemia: Secondary | ICD-10-CM | POA: Diagnosis not present

## 2020-04-03 DIAGNOSIS — I10 Essential (primary) hypertension: Secondary | ICD-10-CM | POA: Diagnosis not present

## 2020-04-03 DIAGNOSIS — K219 Gastro-esophageal reflux disease without esophagitis: Secondary | ICD-10-CM | POA: Diagnosis not present

## 2020-04-11 DIAGNOSIS — Z299 Encounter for prophylactic measures, unspecified: Secondary | ICD-10-CM | POA: Diagnosis not present

## 2020-04-11 DIAGNOSIS — M7918 Myalgia, other site: Secondary | ICD-10-CM | POA: Diagnosis not present

## 2020-04-11 DIAGNOSIS — R109 Unspecified abdominal pain: Secondary | ICD-10-CM | POA: Diagnosis not present

## 2020-04-11 DIAGNOSIS — I10 Essential (primary) hypertension: Secondary | ICD-10-CM | POA: Diagnosis not present

## 2020-05-01 DIAGNOSIS — R1032 Left lower quadrant pain: Secondary | ICD-10-CM | POA: Diagnosis not present

## 2020-05-01 DIAGNOSIS — Z23 Encounter for immunization: Secondary | ICD-10-CM | POA: Diagnosis not present

## 2020-05-01 DIAGNOSIS — M160 Bilateral primary osteoarthritis of hip: Secondary | ICD-10-CM | POA: Diagnosis not present

## 2020-05-01 DIAGNOSIS — I10 Essential (primary) hypertension: Secondary | ICD-10-CM | POA: Diagnosis not present

## 2020-05-01 DIAGNOSIS — N201 Calculus of ureter: Secondary | ICD-10-CM | POA: Diagnosis not present

## 2020-05-01 DIAGNOSIS — R109 Unspecified abdominal pain: Secondary | ICD-10-CM | POA: Diagnosis not present

## 2020-05-01 DIAGNOSIS — Z299 Encounter for prophylactic measures, unspecified: Secondary | ICD-10-CM | POA: Diagnosis not present

## 2020-05-02 DIAGNOSIS — I10 Essential (primary) hypertension: Secondary | ICD-10-CM | POA: Diagnosis not present

## 2020-05-02 DIAGNOSIS — K219 Gastro-esophageal reflux disease without esophagitis: Secondary | ICD-10-CM | POA: Diagnosis not present

## 2020-05-02 DIAGNOSIS — E7849 Other hyperlipidemia: Secondary | ICD-10-CM | POA: Diagnosis not present

## 2020-05-05 DIAGNOSIS — Z299 Encounter for prophylactic measures, unspecified: Secondary | ICD-10-CM | POA: Diagnosis not present

## 2020-05-05 DIAGNOSIS — N201 Calculus of ureter: Secondary | ICD-10-CM | POA: Diagnosis not present

## 2020-05-09 DIAGNOSIS — Z87442 Personal history of urinary calculi: Secondary | ICD-10-CM | POA: Diagnosis not present

## 2020-05-09 DIAGNOSIS — I700 Atherosclerosis of aorta: Secondary | ICD-10-CM | POA: Diagnosis not present

## 2020-05-09 DIAGNOSIS — K8020 Calculus of gallbladder without cholecystitis without obstruction: Secondary | ICD-10-CM | POA: Diagnosis not present

## 2020-05-09 DIAGNOSIS — N201 Calculus of ureter: Secondary | ICD-10-CM | POA: Diagnosis not present

## 2020-07-04 DIAGNOSIS — E7849 Other hyperlipidemia: Secondary | ICD-10-CM | POA: Diagnosis not present

## 2020-07-04 DIAGNOSIS — I10 Essential (primary) hypertension: Secondary | ICD-10-CM | POA: Diagnosis not present

## 2020-07-04 DIAGNOSIS — K219 Gastro-esophageal reflux disease without esophagitis: Secondary | ICD-10-CM | POA: Diagnosis not present

## 2020-08-31 DIAGNOSIS — K8000 Calculus of gallbladder with acute cholecystitis without obstruction: Secondary | ICD-10-CM | POA: Diagnosis not present

## 2020-08-31 DIAGNOSIS — R1030 Lower abdominal pain, unspecified: Secondary | ICD-10-CM | POA: Diagnosis not present

## 2020-08-31 DIAGNOSIS — R109 Unspecified abdominal pain: Secondary | ICD-10-CM | POA: Diagnosis not present

## 2020-08-31 DIAGNOSIS — J9811 Atelectasis: Secondary | ICD-10-CM | POA: Diagnosis not present

## 2020-08-31 DIAGNOSIS — K5909 Other constipation: Secondary | ICD-10-CM | POA: Diagnosis not present

## 2020-08-31 DIAGNOSIS — Z20822 Contact with and (suspected) exposure to covid-19: Secondary | ICD-10-CM | POA: Diagnosis not present

## 2020-08-31 DIAGNOSIS — R0602 Shortness of breath: Secondary | ICD-10-CM | POA: Diagnosis not present

## 2020-08-31 DIAGNOSIS — K8020 Calculus of gallbladder without cholecystitis without obstruction: Secondary | ICD-10-CM | POA: Diagnosis not present

## 2020-08-31 DIAGNOSIS — I700 Atherosclerosis of aorta: Secondary | ICD-10-CM | POA: Diagnosis not present

## 2020-09-01 DIAGNOSIS — I10 Essential (primary) hypertension: Secondary | ICD-10-CM | POA: Diagnosis not present

## 2020-09-01 DIAGNOSIS — Z299 Encounter for prophylactic measures, unspecified: Secondary | ICD-10-CM | POA: Diagnosis not present

## 2020-09-01 DIAGNOSIS — K8010 Calculus of gallbladder with chronic cholecystitis without obstruction: Secondary | ICD-10-CM | POA: Diagnosis not present

## 2020-09-01 DIAGNOSIS — K219 Gastro-esophageal reflux disease without esophagitis: Secondary | ICD-10-CM | POA: Diagnosis not present

## 2020-09-01 DIAGNOSIS — I700 Atherosclerosis of aorta: Secondary | ICD-10-CM | POA: Diagnosis not present

## 2020-09-01 DIAGNOSIS — E7849 Other hyperlipidemia: Secondary | ICD-10-CM | POA: Diagnosis not present

## 2020-09-08 DIAGNOSIS — Z01818 Encounter for other preprocedural examination: Secondary | ICD-10-CM | POA: Diagnosis not present

## 2020-09-08 DIAGNOSIS — K8050 Calculus of bile duct without cholangitis or cholecystitis without obstruction: Secondary | ICD-10-CM | POA: Diagnosis not present

## 2020-09-10 DIAGNOSIS — Z79899 Other long term (current) drug therapy: Secondary | ICD-10-CM | POA: Diagnosis not present

## 2020-09-10 DIAGNOSIS — K8010 Calculus of gallbladder with chronic cholecystitis without obstruction: Secondary | ICD-10-CM | POA: Diagnosis not present

## 2020-09-10 DIAGNOSIS — I10 Essential (primary) hypertension: Secondary | ICD-10-CM | POA: Diagnosis not present

## 2020-09-10 DIAGNOSIS — K8050 Calculus of bile duct without cholangitis or cholecystitis without obstruction: Secondary | ICD-10-CM | POA: Diagnosis not present

## 2020-09-10 DIAGNOSIS — K8064 Calculus of gallbladder and bile duct with chronic cholecystitis without obstruction: Secondary | ICD-10-CM | POA: Diagnosis not present

## 2020-09-10 DIAGNOSIS — Z87891 Personal history of nicotine dependence: Secondary | ICD-10-CM | POA: Diagnosis not present

## 2020-09-10 DIAGNOSIS — Z48815 Encounter for surgical aftercare following surgery on the digestive system: Secondary | ICD-10-CM | POA: Diagnosis not present

## 2020-10-01 DIAGNOSIS — K219 Gastro-esophageal reflux disease without esophagitis: Secondary | ICD-10-CM | POA: Diagnosis not present

## 2020-10-01 DIAGNOSIS — I10 Essential (primary) hypertension: Secondary | ICD-10-CM | POA: Diagnosis not present

## 2020-10-01 DIAGNOSIS — E7849 Other hyperlipidemia: Secondary | ICD-10-CM | POA: Diagnosis not present

## 2020-11-28 DIAGNOSIS — I700 Atherosclerosis of aorta: Secondary | ICD-10-CM | POA: Diagnosis not present

## 2020-11-28 DIAGNOSIS — I10 Essential (primary) hypertension: Secondary | ICD-10-CM | POA: Diagnosis not present

## 2020-11-28 DIAGNOSIS — J069 Acute upper respiratory infection, unspecified: Secondary | ICD-10-CM | POA: Diagnosis not present

## 2020-11-28 DIAGNOSIS — Z299 Encounter for prophylactic measures, unspecified: Secondary | ICD-10-CM | POA: Diagnosis not present

## 2020-12-01 DIAGNOSIS — E039 Hypothyroidism, unspecified: Secondary | ICD-10-CM | POA: Diagnosis not present

## 2020-12-01 DIAGNOSIS — K580 Irritable bowel syndrome with diarrhea: Secondary | ICD-10-CM | POA: Diagnosis not present

## 2020-12-01 DIAGNOSIS — R079 Chest pain, unspecified: Secondary | ICD-10-CM | POA: Diagnosis not present

## 2020-12-01 DIAGNOSIS — E7800 Pure hypercholesterolemia, unspecified: Secondary | ICD-10-CM | POA: Diagnosis not present

## 2020-12-15 DIAGNOSIS — Z6824 Body mass index (BMI) 24.0-24.9, adult: Secondary | ICD-10-CM | POA: Diagnosis not present

## 2020-12-15 DIAGNOSIS — I10 Essential (primary) hypertension: Secondary | ICD-10-CM | POA: Diagnosis not present

## 2020-12-15 DIAGNOSIS — Z299 Encounter for prophylactic measures, unspecified: Secondary | ICD-10-CM | POA: Diagnosis not present

## 2020-12-15 DIAGNOSIS — R5383 Other fatigue: Secondary | ICD-10-CM | POA: Diagnosis not present

## 2020-12-15 DIAGNOSIS — Z7189 Other specified counseling: Secondary | ICD-10-CM | POA: Diagnosis not present

## 2020-12-15 DIAGNOSIS — E7800 Pure hypercholesterolemia, unspecified: Secondary | ICD-10-CM | POA: Diagnosis not present

## 2020-12-15 DIAGNOSIS — Z79899 Other long term (current) drug therapy: Secondary | ICD-10-CM | POA: Diagnosis not present

## 2020-12-15 DIAGNOSIS — Z0000 Encounter for general adult medical examination without abnormal findings: Secondary | ICD-10-CM | POA: Diagnosis not present

## 2020-12-30 DIAGNOSIS — Z789 Other specified health status: Secondary | ICD-10-CM | POA: Diagnosis not present

## 2020-12-30 DIAGNOSIS — E781 Pure hyperglyceridemia: Secondary | ICD-10-CM | POA: Diagnosis not present

## 2020-12-30 DIAGNOSIS — K2970 Gastritis, unspecified, without bleeding: Secondary | ICD-10-CM | POA: Diagnosis not present

## 2020-12-30 DIAGNOSIS — Z299 Encounter for prophylactic measures, unspecified: Secondary | ICD-10-CM | POA: Diagnosis not present

## 2020-12-30 DIAGNOSIS — I10 Essential (primary) hypertension: Secondary | ICD-10-CM | POA: Diagnosis not present

## 2020-12-30 DIAGNOSIS — K219 Gastro-esophageal reflux disease without esophagitis: Secondary | ICD-10-CM | POA: Diagnosis not present

## 2021-01-01 DIAGNOSIS — R079 Chest pain, unspecified: Secondary | ICD-10-CM | POA: Diagnosis not present

## 2021-01-01 DIAGNOSIS — E7800 Pure hypercholesterolemia, unspecified: Secondary | ICD-10-CM | POA: Diagnosis not present

## 2021-01-01 DIAGNOSIS — K580 Irritable bowel syndrome with diarrhea: Secondary | ICD-10-CM | POA: Diagnosis not present

## 2021-01-01 DIAGNOSIS — E039 Hypothyroidism, unspecified: Secondary | ICD-10-CM | POA: Diagnosis not present

## 2021-03-04 DIAGNOSIS — K580 Irritable bowel syndrome with diarrhea: Secondary | ICD-10-CM | POA: Diagnosis not present

## 2021-03-04 DIAGNOSIS — E7800 Pure hypercholesterolemia, unspecified: Secondary | ICD-10-CM | POA: Diagnosis not present

## 2021-03-04 DIAGNOSIS — E039 Hypothyroidism, unspecified: Secondary | ICD-10-CM | POA: Diagnosis not present

## 2021-03-04 DIAGNOSIS — R079 Chest pain, unspecified: Secondary | ICD-10-CM | POA: Diagnosis not present

## 2021-04-14 DIAGNOSIS — Z789 Other specified health status: Secondary | ICD-10-CM | POA: Diagnosis not present

## 2021-04-14 DIAGNOSIS — Z299 Encounter for prophylactic measures, unspecified: Secondary | ICD-10-CM | POA: Diagnosis not present

## 2021-04-14 DIAGNOSIS — I700 Atherosclerosis of aorta: Secondary | ICD-10-CM | POA: Diagnosis not present

## 2021-04-14 DIAGNOSIS — I10 Essential (primary) hypertension: Secondary | ICD-10-CM | POA: Diagnosis not present

## 2021-04-14 DIAGNOSIS — Z6823 Body mass index (BMI) 23.0-23.9, adult: Secondary | ICD-10-CM | POA: Diagnosis not present

## 2021-04-30 DIAGNOSIS — H2513 Age-related nuclear cataract, bilateral: Secondary | ICD-10-CM | POA: Diagnosis not present

## 2021-04-30 DIAGNOSIS — H52223 Regular astigmatism, bilateral: Secondary | ICD-10-CM | POA: Diagnosis not present

## 2021-04-30 DIAGNOSIS — H5203 Hypermetropia, bilateral: Secondary | ICD-10-CM | POA: Diagnosis not present

## 2021-04-30 DIAGNOSIS — H524 Presbyopia: Secondary | ICD-10-CM | POA: Diagnosis not present

## 2021-05-04 DIAGNOSIS — E7800 Pure hypercholesterolemia, unspecified: Secondary | ICD-10-CM | POA: Diagnosis not present

## 2021-05-04 DIAGNOSIS — E039 Hypothyroidism, unspecified: Secondary | ICD-10-CM | POA: Diagnosis not present

## 2021-05-04 DIAGNOSIS — K580 Irritable bowel syndrome with diarrhea: Secondary | ICD-10-CM | POA: Diagnosis not present

## 2021-05-04 DIAGNOSIS — R079 Chest pain, unspecified: Secondary | ICD-10-CM | POA: Diagnosis not present

## 2021-05-05 DIAGNOSIS — Z299 Encounter for prophylactic measures, unspecified: Secondary | ICD-10-CM | POA: Diagnosis not present

## 2021-05-05 DIAGNOSIS — J069 Acute upper respiratory infection, unspecified: Secondary | ICD-10-CM | POA: Diagnosis not present

## 2021-05-05 DIAGNOSIS — G4700 Insomnia, unspecified: Secondary | ICD-10-CM | POA: Diagnosis not present

## 2021-05-05 DIAGNOSIS — R7309 Other abnormal glucose: Secondary | ICD-10-CM | POA: Diagnosis not present

## 2021-05-05 DIAGNOSIS — K8020 Calculus of gallbladder without cholecystitis without obstruction: Secondary | ICD-10-CM | POA: Diagnosis not present

## 2021-06-03 DIAGNOSIS — R079 Chest pain, unspecified: Secondary | ICD-10-CM | POA: Diagnosis not present

## 2021-06-03 DIAGNOSIS — E7800 Pure hypercholesterolemia, unspecified: Secondary | ICD-10-CM | POA: Diagnosis not present

## 2021-06-03 DIAGNOSIS — E039 Hypothyroidism, unspecified: Secondary | ICD-10-CM | POA: Diagnosis not present

## 2021-06-03 DIAGNOSIS — K580 Irritable bowel syndrome with diarrhea: Secondary | ICD-10-CM | POA: Diagnosis not present

## 2021-08-17 DIAGNOSIS — K921 Melena: Secondary | ICD-10-CM | POA: Diagnosis not present

## 2021-08-17 DIAGNOSIS — I10 Essential (primary) hypertension: Secondary | ICD-10-CM | POA: Diagnosis not present

## 2021-08-17 DIAGNOSIS — Z299 Encounter for prophylactic measures, unspecified: Secondary | ICD-10-CM | POA: Diagnosis not present

## 2021-08-17 DIAGNOSIS — I700 Atherosclerosis of aorta: Secondary | ICD-10-CM | POA: Diagnosis not present

## 2021-09-01 DIAGNOSIS — I10 Essential (primary) hypertension: Secondary | ICD-10-CM | POA: Diagnosis not present

## 2021-09-01 DIAGNOSIS — E7800 Pure hypercholesterolemia, unspecified: Secondary | ICD-10-CM | POA: Diagnosis not present

## 2021-09-30 DIAGNOSIS — Z789 Other specified health status: Secondary | ICD-10-CM | POA: Diagnosis not present

## 2021-09-30 DIAGNOSIS — I10 Essential (primary) hypertension: Secondary | ICD-10-CM | POA: Diagnosis not present

## 2021-09-30 DIAGNOSIS — Z299 Encounter for prophylactic measures, unspecified: Secondary | ICD-10-CM | POA: Diagnosis not present

## 2021-09-30 DIAGNOSIS — L259 Unspecified contact dermatitis, unspecified cause: Secondary | ICD-10-CM | POA: Diagnosis not present

## 2021-10-07 DIAGNOSIS — Z7189 Other specified counseling: Secondary | ICD-10-CM | POA: Diagnosis not present

## 2021-10-07 DIAGNOSIS — I10 Essential (primary) hypertension: Secondary | ICD-10-CM | POA: Diagnosis not present

## 2021-10-07 DIAGNOSIS — E781 Pure hyperglyceridemia: Secondary | ICD-10-CM | POA: Diagnosis not present

## 2021-10-07 DIAGNOSIS — Z299 Encounter for prophylactic measures, unspecified: Secondary | ICD-10-CM | POA: Diagnosis not present

## 2021-10-07 DIAGNOSIS — Z0000 Encounter for general adult medical examination without abnormal findings: Secondary | ICD-10-CM | POA: Diagnosis not present

## 2021-10-12 DIAGNOSIS — R0602 Shortness of breath: Secondary | ICD-10-CM | POA: Diagnosis not present

## 2021-12-21 DIAGNOSIS — Z0000 Encounter for general adult medical examination without abnormal findings: Secondary | ICD-10-CM | POA: Diagnosis not present

## 2021-12-21 DIAGNOSIS — R5383 Other fatigue: Secondary | ICD-10-CM | POA: Diagnosis not present

## 2021-12-21 DIAGNOSIS — I10 Essential (primary) hypertension: Secondary | ICD-10-CM | POA: Diagnosis not present

## 2021-12-21 DIAGNOSIS — Z299 Encounter for prophylactic measures, unspecified: Secondary | ICD-10-CM | POA: Diagnosis not present

## 2021-12-21 DIAGNOSIS — D692 Other nonthrombocytopenic purpura: Secondary | ICD-10-CM | POA: Diagnosis not present

## 2021-12-21 DIAGNOSIS — Z79899 Other long term (current) drug therapy: Secondary | ICD-10-CM | POA: Diagnosis not present

## 2021-12-21 DIAGNOSIS — E7800 Pure hypercholesterolemia, unspecified: Secondary | ICD-10-CM | POA: Diagnosis not present

## 2022-05-31 DIAGNOSIS — J069 Acute upper respiratory infection, unspecified: Secondary | ICD-10-CM | POA: Diagnosis not present

## 2022-05-31 DIAGNOSIS — I700 Atherosclerosis of aorta: Secondary | ICD-10-CM | POA: Diagnosis not present

## 2022-05-31 DIAGNOSIS — Z299 Encounter for prophylactic measures, unspecified: Secondary | ICD-10-CM | POA: Diagnosis not present

## 2022-05-31 DIAGNOSIS — D692 Other nonthrombocytopenic purpura: Secondary | ICD-10-CM | POA: Diagnosis not present

## 2022-06-10 DIAGNOSIS — Z299 Encounter for prophylactic measures, unspecified: Secondary | ICD-10-CM | POA: Diagnosis not present

## 2022-06-10 DIAGNOSIS — I700 Atherosclerosis of aorta: Secondary | ICD-10-CM | POA: Diagnosis not present

## 2022-06-10 DIAGNOSIS — H6591 Unspecified nonsuppurative otitis media, right ear: Secondary | ICD-10-CM | POA: Diagnosis not present

## 2022-06-10 DIAGNOSIS — I10 Essential (primary) hypertension: Secondary | ICD-10-CM | POA: Diagnosis not present

## 2022-06-10 DIAGNOSIS — Z789 Other specified health status: Secondary | ICD-10-CM | POA: Diagnosis not present

## 2022-06-10 DIAGNOSIS — R350 Frequency of micturition: Secondary | ICD-10-CM | POA: Diagnosis not present

## 2022-07-17 DIAGNOSIS — R040 Epistaxis: Secondary | ICD-10-CM | POA: Diagnosis not present

## 2022-07-17 DIAGNOSIS — K921 Melena: Secondary | ICD-10-CM | POA: Diagnosis not present

## 2022-09-15 DIAGNOSIS — Z7189 Other specified counseling: Secondary | ICD-10-CM | POA: Diagnosis not present

## 2022-09-15 DIAGNOSIS — I700 Atherosclerosis of aorta: Secondary | ICD-10-CM | POA: Diagnosis not present

## 2022-09-15 DIAGNOSIS — I10 Essential (primary) hypertension: Secondary | ICD-10-CM | POA: Diagnosis not present

## 2022-09-15 DIAGNOSIS — Z0000 Encounter for general adult medical examination without abnormal findings: Secondary | ICD-10-CM | POA: Diagnosis not present

## 2022-09-15 DIAGNOSIS — Z299 Encounter for prophylactic measures, unspecified: Secondary | ICD-10-CM | POA: Diagnosis not present

## 2022-09-15 DIAGNOSIS — D692 Other nonthrombocytopenic purpura: Secondary | ICD-10-CM | POA: Diagnosis not present

## 2022-09-20 ENCOUNTER — Encounter (INDEPENDENT_AMBULATORY_CARE_PROVIDER_SITE_OTHER): Payer: Self-pay | Admitting: *Deleted

## 2022-12-02 ENCOUNTER — Ambulatory Visit (INDEPENDENT_AMBULATORY_CARE_PROVIDER_SITE_OTHER): Payer: Medicare Other | Admitting: Gastroenterology

## 2022-12-16 ENCOUNTER — Ambulatory Visit (INDEPENDENT_AMBULATORY_CARE_PROVIDER_SITE_OTHER): Payer: 59 | Admitting: Gastroenterology

## 2022-12-16 ENCOUNTER — Encounter (INDEPENDENT_AMBULATORY_CARE_PROVIDER_SITE_OTHER): Payer: Self-pay | Admitting: Gastroenterology

## 2022-12-16 VITALS — BP 122/78 | HR 81 | Temp 98.5°F | Ht 73.0 in | Wt 193.9 lb

## 2022-12-16 DIAGNOSIS — K921 Melena: Secondary | ICD-10-CM | POA: Diagnosis not present

## 2022-12-16 DIAGNOSIS — R1012 Left upper quadrant pain: Secondary | ICD-10-CM

## 2022-12-16 DIAGNOSIS — D126 Benign neoplasm of colon, unspecified: Secondary | ICD-10-CM

## 2022-12-16 DIAGNOSIS — K649 Unspecified hemorrhoids: Secondary | ICD-10-CM | POA: Diagnosis not present

## 2022-12-16 DIAGNOSIS — R194 Change in bowel habit: Secondary | ICD-10-CM | POA: Diagnosis not present

## 2022-12-16 NOTE — Progress Notes (Addendum)
Referring Provider: Toma Deiters, MD Primary Care Physician:  Toma Deiters, MD Primary GI Physician: new   Chief Complaint  Patient presents with   Blood In Stools    Referred for blood ins tool. Reports he has hemorrhoids. Using prep H and states it has helped. Would like to discuss banding.    HPI:   Timothy Tyler. is a 56 y.o. male with past medical history of HTN, epilepsy, aortic atherosclerosis   Patient presenting today for melena, abdominal pain, hemorrhoids   Patient states that he has been having black stools for a while now. He notes that he had some bloating to his abdomen on and off as well as some periumbilical/LUQ abdominal pain. He has been on omeprazole 40mg  daily, he has been on this for a long time. He reports very infrequent heartburn. Unsure if certain foods cause him to have abdominal pain. Denies early satiety. No nausea or vomiting. He denies BRBPR. He does not take NSAIDs or drink alcohol.   He is having a BM 1-2x/week. Previously having a BM maybe every other day. Notes some need to strain to defecate at times. He has taken colace in the past with some results. Had some rectal discomfort previously but used preparation H with improvement. States PCP did an exam and told him he had hemorrhoids.  He has a history of seizures though last seizure was atleast a year ago   NSAID use: none  Social hx: no etoh or tobacco  Fam hx: unsure of presence of colon cancer   Last Colonoscopy: 2016, tubular adenomas x4  Last Endoscopy: never   Recommendations:    Past Medical History:  Diagnosis Date   Hypertension     Current Outpatient Medications  Medication Sig Dispense Refill   benazepril-hydrochlorthiazide (LOTENSIN HCT) 20-12.5 MG tablet Take 1 tablet by mouth daily.     carbamazepine (TEGRETOL) 200 MG tablet Take 200 mg by mouth 2 (two) times daily.     Multiple Vitamin (MULTIVITAMIN) tablet Take 1 tablet by mouth daily.     RaNITidine HCl  (ZANTAC PO) Take by mouth.     No current facility-administered medications for this visit.    Allergies as of 12/16/2022   (No Known Allergies)    Family History  Problem Relation Age of Onset   Anemia Mother    COPD Father     Social History   Socioeconomic History   Marital status: Divorced    Spouse name: Not on file   Number of children: Not on file   Years of education: Not on file   Highest education level: Not on file  Occupational History   Not on file  Tobacco Use   Smoking status: Former    Types: Cigarettes    Quit date: 2003    Years since quitting: 21.4   Smokeless tobacco: Never  Substance and Sexual Activity   Alcohol use: Not on file   Drug use: Not on file   Sexual activity: Not on file  Other Topics Concern   Not on file  Social History Narrative   Not on file   Social Determinants of Health   Financial Resource Strain: Not on file  Food Insecurity: Not on file  Transportation Needs: Not on file  Physical Activity: Not on file  Stress: Not on file  Social Connections: Not on file   Review of systems General: negative for malaise, night sweats, fever, chills, weight loss Neck: Negative for lumps,  goiter, pain and significant neck swelling Resp: Negative for cough, wheezing, dyspnea at rest CV: Negative for chest pain, leg swelling, palpitations, orthopnea GI: denies hematochezia, nausea, vomiting, diarrhea, constipation, dysphagia, odyonophagia, early satiety or unintentional weight loss. +melena +periumbilical/LUQ pain +bloating +constipation +hemorrhoids  MSK: Negative for joint pain or swelling, back pain, and muscle pain. Derm: Negative for itching or rash Psych: Denies depression, anxiety, memory loss, confusion. No homicidal or suicidal ideation.  Heme: Negative for prolonged bleeding, bruising easily, and swollen nodes. Endocrine: Negative for cold or heat intolerance, polyuria, polydipsia and goiter. Neuro: negative for tremor,  gait imbalance, syncope and seizures. The remainder of the review of systems is noncontributory.  Physical Exam: There were no vitals taken for this visit. General:   Alert and oriented. No distress noted. Pleasant and cooperative.  Head:  Normocephalic and atraumatic. Eyes:  Conjuctiva clear without scleral icterus. Mouth:  Oral mucosa pink and moist. Good dentition. No lesions. Heart: Normal rate and rhythm, s1 and s2 heart sounds present.  Lungs: Clear lung sounds in all lobes. Respirations equal and unlabored. Abdomen:  +BS, soft, non-tender and non-distended. No rebound or guarding. No HSM or masses noted. Rectal: deferred  Derm: No palmar erythema or jaundice Msk:  Symmetrical without gross deformities. Normal posture. Extremities:  Without edema. Neurologic:  Alert and  oriented x4 Psych:  Alert and cooperative. Normal mood and affect.  Invalid input(s): "6 MONTHS"   ASSESSMENT: Timothy Tyler. is a 57 y.o. male presenting today as a new patient for melena, abdominal pain and constipation/change in bowel habits.  Melena/periumbilical to LUQ pain: unclear precipitating or alleviating factors, some bloating at times. No nausea, vomiting, early satiety or weight loss. Denies NSAIDs or ETOH. Etiology unclear, recommend proceeding with EGD for further evaluation as I cannot rule out PUD, gastritis, duodenitis, malignancy.   Hemorrhoids: using preparation H with some improvement. Denies BRBPR. We briefly discussed hemorrhoid banding and can consider proceeding with this after colonoscopy if he continues toh ave issues with his hemorrhoids  Change in bowel habits/History of tubular adenomas: notes worsening constipation recently, however, he states that he had more ease of passing stools after using preparation H for his hemorrhoids. He does not want to try miralax for his constipation, advised he can continue to use colace PRN. Last Colonoscopy in 2016 with tubular adenomas x4,  recommend proceeding with Colonoscopy at this time.   Indications, risks and benefits of procedure discussed in detail with patient. Patient verbalized understanding and is in agreement to proceed with EGD/Colonoscopy.  PLAN:  Schedule EGD/colonoscopy-ASA III 2.   Continue with omeprazole 40mg  once daily 3. Increase water intake, aim for atleast 64 oz per day,Increase fruits, veggies and whole grains, kiwi and prunes are especially good for constipation 4. Colace PRN for constipation 5 consider hemorrhoid banding after colonoscopy   All questions were answered, patient verbalized understanding and is in agreement with plan as outlined above.   Follow Up: 3 months   Timothy Bartel L. Jeanmarie Hubert, MSN, APRN, AGNP-C Adult-Gerontology Nurse Practitioner [facility]  I have reviewed the note and agree with the APP's assessment as described in this progress note  Katrinka Blazing, MD Gastroenterology and Hepatology [facility]

## 2022-12-16 NOTE — Patient Instructions (Signed)
We will get you scheduled for upper endoscopy to evaluate your abdominal pain and black stools as well as a colonoscopy  Increase water intake, aim for atleast 64 oz per day Increase fruits, veggies and whole grains, kiwi and prunes are especially good for constipation You can continue to use colace for constipation, ideally you should avoid straining to help prevent worsening of hemorrhoids We can discuss hemorrhoid banding after colonoscopy if you continue to have issues with these  Follow up 3 months

## 2022-12-17 DIAGNOSIS — K649 Unspecified hemorrhoids: Secondary | ICD-10-CM | POA: Insufficient documentation

## 2022-12-29 DIAGNOSIS — Z0000 Encounter for general adult medical examination without abnormal findings: Secondary | ICD-10-CM | POA: Diagnosis not present

## 2022-12-29 DIAGNOSIS — E7800 Pure hypercholesterolemia, unspecified: Secondary | ICD-10-CM | POA: Diagnosis not present

## 2022-12-29 DIAGNOSIS — R5383 Other fatigue: Secondary | ICD-10-CM | POA: Diagnosis not present

## 2022-12-29 DIAGNOSIS — Z299 Encounter for prophylactic measures, unspecified: Secondary | ICD-10-CM | POA: Diagnosis not present

## 2022-12-29 DIAGNOSIS — I10 Essential (primary) hypertension: Secondary | ICD-10-CM | POA: Diagnosis not present

## 2022-12-29 DIAGNOSIS — Z79899 Other long term (current) drug therapy: Secondary | ICD-10-CM | POA: Diagnosis not present

## 2022-12-30 DIAGNOSIS — R5383 Other fatigue: Secondary | ICD-10-CM | POA: Diagnosis not present

## 2022-12-30 DIAGNOSIS — Z79899 Other long term (current) drug therapy: Secondary | ICD-10-CM | POA: Diagnosis not present

## 2022-12-30 DIAGNOSIS — E7800 Pure hypercholesterolemia, unspecified: Secondary | ICD-10-CM | POA: Diagnosis not present

## 2023-01-19 ENCOUNTER — Telehealth (INDEPENDENT_AMBULATORY_CARE_PROVIDER_SITE_OTHER): Payer: Self-pay | Admitting: Gastroenterology

## 2023-01-19 MED ORDER — PEG 3350-KCL-NA BICARB-NACL 420 G PO SOLR: 4000.0000 mL | Freq: Once | ORAL | 0 refills | Status: DC

## 2023-01-19 NOTE — Telephone Encounter (Signed)
Pt contacted and EGD/TCS scheduled for 02/08/23. Prep sent to pharmacy. Instructions mailed to patient. Will call pt with pre op appt  Notification or Prior Authorization is not required for the requested services You are not required to submit a notification/prior authorization based on the information provided. If you have general questions about the prior authorization requirements, visit [email or web address] > Clinician Resources > Advance and Admission Notification Requirements. The number above acknowledges your notification. Please write this reference number down for future reference. If you would like to request an organization determination, please call us at [phone]. Decision ID #: [id number]

## 2023-01-26 NOTE — Telephone Encounter (Signed)
Contacted pt with pre op appt. Pt states that he is changing insurance to [place] on 02/03/23. Advised pt to bring a copy of [employer] card so we could do PA through [facility]. Pt verbalized understanding.

## 2023-02-01 NOTE — Patient Instructions (Signed)
Timothy Tyler.  02/01/2023     @PREFPERIOPPHARMACY @   Your procedure is scheduled on  02/08/2023.   Report to [facility] at  1150  A.M.   Call this number if you have problems the morning of surgery:  [phone]  If you experience any cold or flu symptoms such as cough, fever, chills, shortness of breath, etc. between now and your scheduled surgery, please notify us at the above number.   Remember:  Follow the diet and prep instructions given to you by the office.    Take these medicines the morning of surgery with A SIP OF WATER                         tegretol, omeprazole, flomax.     Do not wear jewelry, make-up or nail polish, including gel polish,  artificial nails, or any other type of covering on natural nails (fingers and  toes).  Do not wear lotions, powders, or perfumes, or deodorant.  Do not shave 48 hours prior to surgery.  Men may shave face and neck.  Do not bring valuables to the hospital.  [facility] is not responsible for any belongings or valuables.  Contacts, dentures or bridgework may not be worn into surgery.  Leave your suitcase in the car.  After surgery it may be brought to your room.  For patients admitted to the hospital, discharge time will be determined by your treatment team.  Patients discharged the day of surgery will not be allowed to drive home and must have someone with them for 24 hours.    Special instructions:   DO NOT smoke tobacco or vape for 24 hours before your procedure.  Please read over the following fact sheets that you were given. Anesthesia Post-op Instructions and Care and Recovery After Surgery       Upper Endoscopy, Adult, Care After After the procedure, it is common to have a sore throat. It is also common to have: Mild stomach pain or discomfort. Bloating. Nausea. Follow these instructions at home: The instructions below may help you care for yourself at home. Your health care provider may  give you more instructions. If you have questions, ask your health care provider. If you were given a sedative during the procedure, it can affect you for several hours. Do not drive or operate machinery until your health care provider says that it is safe. If you will be going home right after the procedure, plan to have a responsible adult: Take you home from the hospital or clinic. You will not be allowed to drive. Care for you for the time you are told. Follow instructions from your health care provider about what you may eat and drink. Return to your normal activities as told by your health care provider. Ask your health care provider what activities are safe for you. Take over-the-counter and prescription medicines only as told by your health care provider. Contact a health care provider if you: Have a sore throat that lasts longer than one day. Have trouble swallowing. Have a fever. Get help right away if you: Vomit blood or your vomit looks like coffee grounds. Have bloody, black, or tarry stools. Have a very bad sore throat or you cannot swallow. Have difficulty breathing or very bad pain in your chest or abdomen. These symptoms may be an emergency. Get help right away. Call 911. Do not wait to see if  the symptoms will go away. Do not drive yourself to the hospital. Summary After the procedure, it is common to have a sore throat, mild stomach discomfort, bloating, and nausea. If you were given a sedative during the procedure, it can affect you for several hours. Do not drive until your health care provider says that it is safe. Follow instructions from your health care provider about what you may eat and drink. Return to your normal activities as told by your health care provider. This information is not intended to replace advice given to you by your health care provider. Make sure you discuss any questions you have with your health care provider. Document Revised: 09/30/2021  Document Reviewed: 09/30/2021 Elsevier Patient Education  2024 [employer] Colonoscopy, Adult, Care After The following information offers guidance on how to care for yourself after your procedure. Your health care provider may also give you more specific instructions. If you have problems or questions, contact your health care provider. What can I expect after the procedure? After the procedure, it is common to have: A small amount of blood in your stool for 24 hours after the procedure. Some gas. Mild cramping or bloating of your abdomen. Follow these instructions at home: Eating and drinking  Drink enough fluid to keep your urine pale yellow. Follow instructions from your health care provider about eating or drinking restrictions. Resume your normal diet as told by your health care provider. Avoid heavy or fried foods that are hard to digest. Activity Rest as told by your health care provider. Avoid sitting for a long time without moving. Get up to take short walks every 1-2 hours. This is important to improve blood flow and breathing. Ask for help if you feel weak or unsteady. Return to your normal activities as told by your health care provider. Ask your health care provider what activities are safe for you. Managing cramping and bloating  Try walking around when you have cramps or feel bloated. If directed, apply heat to your abdomen as told by your health care provider. Use the heat source that your health care provider recommends, such as a moist heat pack or a heating pad. Place a towel between your skin and the heat source. Leave the heat on for 20-30 minutes. Remove the heat if your skin turns bright red. This is especially important if you are unable to feel pain, heat, or cold. You have a greater risk of getting burned. General instructions If you were given a sedative during the procedure, it can affect you for several hours. Do not drive or operate machinery until your  health care provider says that it is safe. For the first 24 hours after the procedure: Do not sign important documents. Do not drink alcohol. Do your regular daily activities at a slower pace than normal. Eat soft foods that are easy to digest. Take over-the-counter and prescription medicines only as told by your health care provider. Keep all follow-up visits. This is important. Contact a health care provider if: You have blood in your stool 2-3 days after the procedure. Get help right away if: You have more than a small spotting of blood in your stool. You have large blood clots in your stool. You have swelling of your abdomen. You have nausea or vomiting. You have a fever. You have increasing pain in your abdomen that is not relieved with medicine. These symptoms may be an emergency. Get help right away. Call 911. Do not wait to see if  the symptoms will go away. Do not drive yourself to the hospital. Summary After the procedure, it is common to have a small amount of blood in your stool. You may also have mild cramping and bloating of your abdomen. If you were given a sedative during the procedure, it can affect you for several hours. Do not drive or operate machinery until your health care provider says that it is safe. Get help right away if you have a lot of blood in your stool, nausea or vomiting, a fever, or increased pain in your abdomen. This information is not intended to replace advice given to you by your health care provider. Make sure you discuss any questions you have with your health care provider. Document Revised: 08/03/2022 Document Reviewed: 02/11/2021 Elsevier Patient Education  2024 Elsevier Inc. Monitored Anesthesia Care, Care After The following information offers guidance on how to care for yourself after your procedure. Your health care provider may also give you more specific instructions. If you have problems or questions, contact your health care  provider. What can I expect after the procedure? After the procedure, it is common to have: Tiredness. Little or no memory about what happened during or after the procedure. Impaired judgment when it comes to making decisions. Nausea or vomiting. Some trouble with balance. Follow these instructions at home: For the time period you were told by your health care provider:  Rest. Do not participate in activities where you could fall or become injured. Do not drive or use machinery. Do not drink alcohol. Do not take sleeping pills or medicines that cause drowsiness. Do not make important decisions or sign legal documents. Do not take care of children on your own. Medicines Take over-the-counter and prescription medicines only as told by your health care provider. If you were prescribed antibiotics, take them as told by your health care provider. Do not stop using the antibiotic even if you start to feel better. Eating and drinking Follow instructions from your health care provider about what you may eat and drink. Drink enough fluid to keep your urine pale yellow. If you vomit: Drink clear fluids slowly and in small amounts as you are able. Clear fluids include water, ice chips, low-calorie sports drinks, and fruit juice that has water added to it (diluted fruit juice). Eat light and bland foods in small amounts as you are able. These foods include bananas, applesauce, rice, lean meats, toast, and crackers. General instructions  Have a responsible adult stay with you for the time you are told. It is important to have someone help care for you until you are awake and alert. If you have sleep apnea, surgery and some medicines can increase your risk for breathing problems. Follow instructions from your health care provider about wearing your sleep device: When you are sleeping. This includes during daytime naps. While taking prescription pain medicines, sleeping medicines, or medicines that  make you drowsy. Do not use any products that contain nicotine or tobacco. These products include cigarettes, chewing tobacco, and vaping devices, such as e-cigarettes. If you need help quitting, ask your health care provider. Contact a health care provider if: You feel nauseous or vomit every time you eat or drink. You feel light-headed. You are still sleepy or having trouble with balance after 24 hours. You get a rash. You have a fever. You have redness or swelling around the IV site. Get help right away if: You have trouble breathing. You have new confusion after you get home.  These symptoms may be an emergency. Get help right away. Call 911. Do not wait to see if the symptoms will go away. Do not drive yourself to the hospital. This information is not intended to replace advice given to you by your health care provider. Make sure you discuss any questions you have with your health care provider. Document Revised: 11/16/2021 Document Reviewed: 11/16/2021 Elsevier Patient Education  2024 [employer].

## 2023-02-03 ENCOUNTER — Other Ambulatory Visit: Payer: Self-pay

## 2023-02-03 ENCOUNTER — Encounter (HOSPITAL_COMMUNITY)
Admission: RE | Admit: 2023-02-03 | Discharge: 2023-02-03 | Disposition: A | Discharge status: Home / Self Care | Payer: Medicare PPO | Source: Ambulatory Visit | Attending: Gastroenterology | Admitting: Gastroenterology

## 2023-02-03 ENCOUNTER — Encounter (HOSPITAL_COMMUNITY): Payer: Self-pay

## 2023-02-03 VITALS — BP 127/67 | Temp 98.3°F | Resp 18 | Ht 73.0 in | Wt 197.0 lb

## 2023-02-03 DIAGNOSIS — Z79899 Other long term (current) drug therapy: Secondary | ICD-10-CM | POA: Insufficient documentation

## 2023-02-03 DIAGNOSIS — Z01812 Encounter for preprocedural laboratory examination: Secondary | ICD-10-CM | POA: Diagnosis not present

## 2023-02-03 DIAGNOSIS — I10 Essential (primary) hypertension: Secondary | ICD-10-CM

## 2023-02-03 DIAGNOSIS — Z01810 Encounter for preprocedural cardiovascular examination: Secondary | ICD-10-CM | POA: Insufficient documentation

## 2023-02-03 DIAGNOSIS — Z01818 Encounter for other preprocedural examination: Secondary | ICD-10-CM | POA: Diagnosis present

## 2023-02-03 HISTORY — DX: Unspecified osteoarthritis, unspecified site: M19.90

## 2023-02-03 HISTORY — DX: Dyspnea, unspecified: R06.00

## 2023-02-03 LAB — BASIC METABOLIC PANEL
Anion gap: 12 (ref 5–15)
BUN: 20 mg/dL (ref 6–20)
CO2: 21 mmol/L — ABNORMAL LOW (ref 22–32)
Calcium: 9.3 mg/dL (ref 8.9–10.3)
Chloride: 102 mmol/L (ref 98–111)
Creatinine, Ser: 0.94 mg/dL (ref 0.61–1.24)
GFR, Estimated: >60 mL/min (ref >60)
Glucose, Bld: 109 mg/dL — ABNORMAL HIGH (ref 70–99)
Potassium: 3.4 mmol/L — ABNORMAL LOW (ref 3.5–5.1)
Sodium: 135 mmol/L (ref 135–145)

## 2023-02-07 ENCOUNTER — Other Ambulatory Visit (INDEPENDENT_AMBULATORY_CARE_PROVIDER_SITE_OTHER): Payer: Self-pay

## 2023-02-07 MED ORDER — PEG 3350-KCL-NA BICARB-NACL 420 G PO SOLR: 4000.0000 mL | Freq: Once | ORAL | 0 refills | Status: AC

## 2023-02-07 NOTE — OR Nursing (Signed)
Patient aware to arrive 0630

## 2023-02-08 ENCOUNTER — Encounter (HOSPITAL_COMMUNITY)
Admission: RE | Disposition: A | Discharge status: Home / Self Care | Payer: Self-pay | Source: Home / Self Care | Attending: Gastroenterology

## 2023-02-08 ENCOUNTER — Ambulatory Visit (HOSPITAL_COMMUNITY)
Admission: RE | Admit: 2023-02-08 | Discharge: 2023-02-08 | Disposition: A | Discharge status: Home / Self Care | Payer: Medicare PPO | Attending: Gastroenterology | Admitting: Gastroenterology

## 2023-02-08 ENCOUNTER — Ambulatory Visit (HOSPITAL_BASED_OUTPATIENT_CLINIC_OR_DEPARTMENT_OTHER): Payer: Medicare PPO | Admitting: Anesthesiology

## 2023-02-08 ENCOUNTER — Ambulatory Visit (HOSPITAL_COMMUNITY): Payer: Medicare PPO | Admitting: Anesthesiology

## 2023-02-08 ENCOUNTER — Encounter (HOSPITAL_COMMUNITY): Payer: Self-pay | Admitting: Gastroenterology

## 2023-02-08 ENCOUNTER — Encounter (INDEPENDENT_AMBULATORY_CARE_PROVIDER_SITE_OTHER): Payer: Self-pay | Admitting: *Deleted

## 2023-02-08 ENCOUNTER — Other Ambulatory Visit: Payer: Self-pay

## 2023-02-08 DIAGNOSIS — D126 Benign neoplasm of colon, unspecified: Secondary | ICD-10-CM | POA: Diagnosis not present

## 2023-02-08 DIAGNOSIS — Z86010 Personal history of colonic polyps: Secondary | ICD-10-CM | POA: Diagnosis not present

## 2023-02-08 DIAGNOSIS — K635 Polyp of colon: Secondary | ICD-10-CM | POA: Diagnosis not present

## 2023-02-08 DIAGNOSIS — D123 Benign neoplasm of transverse colon: Secondary | ICD-10-CM | POA: Diagnosis not present

## 2023-02-08 DIAGNOSIS — K648 Other hemorrhoids: Secondary | ICD-10-CM | POA: Diagnosis not present

## 2023-02-08 DIAGNOSIS — R1010 Upper abdominal pain, unspecified: Secondary | ICD-10-CM | POA: Diagnosis not present

## 2023-02-08 DIAGNOSIS — D122 Benign neoplasm of ascending colon: Secondary | ICD-10-CM | POA: Insufficient documentation

## 2023-02-08 DIAGNOSIS — Z1211 Encounter for screening for malignant neoplasm of colon: Secondary | ICD-10-CM | POA: Insufficient documentation

## 2023-02-08 DIAGNOSIS — K621 Rectal polyp: Secondary | ICD-10-CM | POA: Insufficient documentation

## 2023-02-08 DIAGNOSIS — K3189 Other diseases of stomach and duodenum: Secondary | ICD-10-CM

## 2023-02-08 DIAGNOSIS — K921 Melena: Secondary | ICD-10-CM | POA: Diagnosis not present

## 2023-02-08 DIAGNOSIS — K219 Gastro-esophageal reflux disease without esophagitis: Secondary | ICD-10-CM | POA: Insufficient documentation

## 2023-02-08 DIAGNOSIS — K644 Residual hemorrhoidal skin tags: Secondary | ICD-10-CM | POA: Diagnosis not present

## 2023-02-08 DIAGNOSIS — D124 Benign neoplasm of descending colon: Secondary | ICD-10-CM | POA: Insufficient documentation

## 2023-02-08 DIAGNOSIS — G40909 Epilepsy, unspecified, not intractable, without status epilepticus: Secondary | ICD-10-CM | POA: Insufficient documentation

## 2023-02-08 DIAGNOSIS — D128 Benign neoplasm of rectum: Secondary | ICD-10-CM

## 2023-02-08 DIAGNOSIS — I10 Essential (primary) hypertension: Secondary | ICD-10-CM | POA: Diagnosis not present

## 2023-02-08 DIAGNOSIS — D125 Benign neoplasm of sigmoid colon: Secondary | ICD-10-CM | POA: Diagnosis not present

## 2023-02-08 DIAGNOSIS — Z87891 Personal history of nicotine dependence: Secondary | ICD-10-CM | POA: Diagnosis not present

## 2023-02-08 DIAGNOSIS — R1012 Left upper quadrant pain: Secondary | ICD-10-CM

## 2023-02-08 DIAGNOSIS — R569 Unspecified convulsions: Secondary | ICD-10-CM | POA: Diagnosis not present

## 2023-02-08 HISTORY — PX: ESOPHAGOGASTRODUODENOSCOPY (EGD) WITH PROPOFOL: SHX5813

## 2023-02-08 HISTORY — PX: POLYPECTOMY: SHX5525

## 2023-02-08 HISTORY — PX: COLONOSCOPY WITH PROPOFOL: SHX5780

## 2023-02-08 HISTORY — PX: BIOPSY: SHX5522

## 2023-02-08 LAB — CBC
HCT: 39 % (ref 39.0–52.0)
Hemoglobin: 13.8 g/dL (ref 13.0–17.0)
MCH: 31.7 pg (ref 26.0–34.0)
MCHC: 35.4 g/dL (ref 30.0–36.0)
MCV: 89.4 fL (ref 80.0–100.0)
Platelets: 211 10*3/uL (ref 150–400)
RBC: 4.36 MIL/uL (ref 4.22–5.81)
RDW: 12.5 % (ref 11.5–15.5)
WBC: 5.4 10*3/uL (ref 4.0–10.5)
nRBC: 0 % (ref 0.0–0.2)

## 2023-02-08 LAB — HM COLONOSCOPY

## 2023-02-08 SURGERY — ESOPHAGOGASTRODUODENOSCOPY (EGD) WITH PROPOFOL
Anesthesia: General

## 2023-02-08 MED ORDER — PROPOFOL 1000 MG/100ML IV EMUL
INTRAVENOUS | Status: AC
  Filled 2023-02-08: qty 100

## 2023-02-08 MED ORDER — LIDOCAINE HCL (CARDIAC) PF 100 MG/5ML IV SOSY
PREFILLED_SYRINGE | INTRAVENOUS | Status: DC | PRN
  Administered 2023-02-08: 100 mg via INTRAVENOUS

## 2023-02-08 MED ORDER — LIDOCAINE HCL (PF) 2 % IJ SOLN
INTRAMUSCULAR | Status: AC
  Filled 2023-02-08: qty 10

## 2023-02-08 MED ORDER — LACTATED RINGERS IV SOLN: INTRAVENOUS | Status: DC

## 2023-02-08 MED ORDER — PROPOFOL 500 MG/50ML IV EMUL
INTRAVENOUS | Status: DC | PRN
  Administered 2023-02-08: 150 ug/kg/min via INTRAVENOUS

## 2023-02-08 MED ORDER — PROPOFOL 10 MG/ML IV BOLUS
INTRAVENOUS | Status: DC | PRN
Start: 2023-02-08 — End: 2023-02-08
  Administered 2023-02-08: 40 mg via INTRAVENOUS
  Administered 2023-02-08: 80 mg via INTRAVENOUS

## 2023-02-08 MED ORDER — PHENYLEPHRINE 80 MCG/ML (10ML) SYRINGE FOR IV PUSH (FOR BLOOD PRESSURE SUPPORT)
PREFILLED_SYRINGE | INTRAVENOUS | Status: AC
  Filled 2023-02-08: qty 10

## 2023-02-08 NOTE — H&P (Signed)
Timothy Tyler. is an 57 y.o. male.   Chief Complaint: melena , history of colon polyps HPI: Timothy Tyler. is a 57 y.o. male with past medical history of HTN, epilepsy, aortic atherosclerosis, who comes for evaluation of melena and history of colon polyps.  Patient reports that he has presented intermittent episodes of melena for several months. No NSAIDs. The patient denies having any nausea, vomiting, fever, chills, hematochezia, melena, hematemesis, abdominal distention, abdominal pain, diarrhea, jaundice, pruritus or weight loss.  Past Medical History:  Diagnosis Date   Arthritis    BPH (benign prostatic hyperplasia)    Dyspnea    GERD (gastroesophageal reflux disease)    Hyperlipemia    Hypertension    Irregular heart rate    Seizure (HCC)     Past Surgical History:  Procedure Laterality Date   CHOLECYSTECTOMY      Family History  Problem Relation Age of Onset   Anemia Mother    COPD Father    Social History:  reports that he quit smoking about 21 years ago. His smoking use included cigarettes. He has been exposed to tobacco smoke. He has never used smokeless tobacco. He reports that he does not use drugs. No history on file for alcohol use.  Allergies: No Known Allergies  Medications Prior to Admission  Medication Sig Dispense Refill   atorvastatin (LIPITOR) 20 MG tablet Take 20 mg by mouth daily.     benazepril-hydrochlorthiazide (LOTENSIN HCT) 20-12.5 MG tablet Take 1 tablet by mouth daily.     carbamazepine (TEGRETOL) 200 MG tablet Take 200 mg by mouth 2 (two) times daily.     gemfibrozil (LOPID) 600 MG tablet Take 600 mg by mouth 2 (two) times daily.     metoprolol tartrate (LOPRESSOR) 25 MG tablet Take 25 mg by mouth 2 (two) times daily.     Multiple Vitamin (MULTIVITAMIN) tablet Take 1 tablet by mouth daily.     omeprazole (PRILOSEC) 40 MG capsule Take 40 mg by mouth every morning.     tamsulosin (FLOMAX) 0.4 MG CAPS capsule Take 0.4 mg by mouth  2 (two) times daily.     carbamazepine (TEGRETOL XR) 200 MG 12 hr tablet Take 200 mg by mouth 2 (two) times daily.      No results found for this or any previous visit (from the past 48 hour(s)). No results found.  Review of Systems  Gastrointestinal:  Positive for blood in stool.  All other systems reviewed and are negative.   Blood pressure 139/80, pulse 94, temperature 98.6 F (37 C), temperature source Oral, resp. rate 17, height 6\' 1"  (1.854 m), weight 89.4 kg, SpO2 99%. Physical Exam  GENERAL: The patient is AO x3, in no acute distress. HEENT: Head is normocephalic and atraumatic. EOMI are intact. Mouth is well hydrated and without lesions. NECK: Supple. No masses LUNGS: Clear to auscultation. No presence of rhonchi/wheezing/rales. Adequate chest expansion HEART: RRR, normal s1 and s2. ABDOMEN: Soft, nontender, no guarding, no peritoneal signs, and nondistended. BS +. No masses. EXTREMITIES: Without any cyanosis, clubbing, rash, lesions or edema. NEUROLOGIC: AOx3, no focal motor deficit. SKIN: no jaundice, no rashes  Assessment/Plan Timothy Tyler. is a 57 y.o. male with past medical history of HTN, epilepsy, aortic atherosclerosis, who comes for evaluation of melena and history of colon polyps. Will proceed with EGD and colonoscopy  Dolores Frame, MD 02/08/2023, 7:31 AM

## 2023-02-08 NOTE — Anesthesia Preprocedure Evaluation (Signed)
Anesthesia Evaluation  Patient identified by MRN, date of birth, ID band Patient awake    Reviewed: Allergy & Precautions, H&P , NPO status , Patient's Chart, lab work & pertinent test results, reviewed documented beta blocker date and time   Airway Mallampati: II  TM Distance: >3 FB Neck ROM: Full    Dental  (+) Edentulous Upper, Missing, Dental Advisory Given   Pulmonary shortness of breath, former smoker   Pulmonary exam normal breath sounds clear to auscultation       Cardiovascular Exercise Tolerance: Good hypertension, Pt. on medications and Pt. on home beta blockers Normal cardiovascular exam Rhythm:Regular Rate:Normal     Neuro/Psych Seizures - (last seizure - couple of years ago), Well Controlled,   negative psych ROS   GI/Hepatic Neg liver ROS,GERD  Medicated and Controlled,,  Endo/Other  negative endocrine ROS    Renal/GU negative Renal ROS  negative genitourinary   Musculoskeletal  (+) Arthritis , Osteoarthritis,    Abdominal   Peds negative pediatric ROS (+)  Hematology negative hematology ROS (+)   Anesthesia Other Findings   Reproductive/Obstetrics negative OB ROS                             Anesthesia Physical Anesthesia Plan  ASA: 2  Anesthesia Plan: General   Post-op Pain Management: Minimal or no pain anticipated   Induction: Intravenous  PONV Risk Score and Plan: 1 and Propofol infusion  Airway Management Planned: Nasal Cannula and Natural Airway  Additional Equipment:   Intra-op Plan:   Post-operative Plan:   Informed Consent: I have reviewed the patients History and Physical, chart, labs and discussed the procedure including the risks, benefits and alternatives for the proposed anesthesia with the patient or authorized representative who has indicated his/her understanding and acceptance.     Dental advisory given  Plan Discussed with: CRNA and  Surgeon  Anesthesia Plan Comments:        Anesthesia Quick Evaluation

## 2023-02-08 NOTE — Discharge Instructions (Addendum)
You are being discharged to home.  Resume your previous diet.  We are waiting for your pathology results.  Your physician has recommended a repeat colonoscopy for surveillance based on pathology results.  

## 2023-02-08 NOTE — Transfer of Care (Signed)
Immediate Anesthesia Transfer of Care Note  Patient: Timothy Tyler.  Procedure(s) Performed: ESOPHAGOGASTRODUODENOSCOPY (EGD) WITH PROPOFOL COLONOSCOPY WITH PROPOFOL BIOPSY POLYPECTOMY  Patient Location: Short Stay  Anesthesia Type:General  Level of Consciousness: drowsy  Airway & Oxygen Therapy: Patient Spontanous Breathing  Post-op Assessment: Report given to RN and Post -op Vital signs reviewed and stable  Post vital signs: Reviewed and stable  Last Vitals:  Vitals Value Taken Time  BP 108/66 02/08/23 0821  Temp 36.4 C 02/08/23 0821  Pulse 74 02/08/23 0821  Resp 20 02/08/23 0821  SpO2 98 % 02/08/23 0821    Last Pain:  Vitals:   02/08/23 0821  TempSrc: Axillary  PainSc: 0-No pain         Complications: No notable events documented.

## 2023-02-08 NOTE — Op Note (Signed)
[facility] Patient Name: Timothy Tyler Procedure Date: 02/08/2023 7:07 AM MRN: [id number] Date of Birth: [birth year] Attending MD: Katrinka Blazing , , [id number] CSN: [id number] Age: 57 Admit Type: Outpatient Procedure:                Colonoscopy Indications:              Surveillance: Personal history of adenomatous                            polyps on last colonoscopy > 5 years ago Providers:                Katrinka Blazing, Edrick Kins, RN, Elinor Parkinson, Dyann Ruddle Referring MD:              Medicines:                Monitored Anesthesia Care Complications:            No immediate complications. Estimated Blood Loss:     Estimated blood loss: none. Procedure:                Pre-Anesthesia Assessment:                           - Prior to the procedure, a History and Physical                            was performed, and patient medications, allergies                            and sensitivities were reviewed. The patient's                            tolerance of previous anesthesia was reviewed.                           - The risks and benefits of the procedure and the                            sedation options and risks were discussed with the                            patient. All questions were answered and informed                            consent was obtained.                           - ASA Grade Assessment: II - A patient with mild                            systemic disease.                           After obtaining informed consent, the colonoscope  was passed under direct vision. Throughout the                            procedure, the patient's blood pressure, pulse, and                            oxygen saturations were monitored continuously. The                            [id number]-[id number] ([id number]) scope was introduced through                            the anus and advanced to the the cecum, identified                             by appendiceal orifice and ileocecal valve. The                            colonoscopy was performed without difficulty. The                            patient tolerated the procedure well. The quality                            of the bowel preparation was adequate. Scope In: 7:53:29 AM Scope Out: 8:19:09 AM Scope Withdrawal Time: 0 hours 22 minutes 35 seconds  Total Procedure Duration: 0 hours 25 minutes 40 seconds  Findings:      Skin tags were found on perianal exam.      Three sessile polyps were found in the transverse colon and ascending       colon. The polyps were 3 to 5 mm in size. These polyps were removed with       a cold snare. Resection and retrieval were complete.      Four sessile and semi-pedunculated polyps were found in the rectum,       sigmoid colon and descending colon. The polyps were 3 to 8 mm in size.       These polyps were removed with a cold snare. Resection and retrieval       were complete.      Non-bleeding internal hemorrhoids were found during retroflexion. The       hemorrhoids were small. Impression:               - Perianal skin tags found on perianal exam.                           - Three 3 to 5 mm polyps in the transverse colon                            and in the ascending colon, removed with a cold                            snare. Resected and retrieved.                           -  Four 3 to 8 mm polyps in the rectum, in the                            sigmoid colon and in the descending colon, removed                            with a cold snare. Resected and retrieved.                           - Non-bleeding internal hemorrhoids. Moderate Sedation:      Per Anesthesia Care Recommendation:           - Discharge patient to home (ambulatory).                           - Resume previous diet.                           - Await pathology results.                           - Repeat colonoscopy for surveillance based on                             pathology results. Procedure Code(s):        --- Professional ---                           [phone], Colonoscopy, flexible; with removal of                            tumor(s), polyp(s), or other lesion(s) by snare                            technique Diagnosis Code(s):        --- Professional ---                           Z86.010, Personal history of colonic polyps                           D12.3, Benign neoplasm of transverse colon (hepatic                            flexure or splenic flexure)                           D12.2, Benign neoplasm of ascending colon                           D12.8, Benign neoplasm of rectum                           D12.5, Benign neoplasm of sigmoid colon                           D12.4, Benign neoplasm of descending  colon                           K64.4, Residual hemorrhoidal skin tags                           K64.8, Other hemorrhoids CPT copyright 2022 American Medical Association. All rights reserved. The codes documented in this report are preliminary and upon coder review may  be revised to meet current compliance requirements. Katrinka Blazing, MD Katrinka Blazing,  02/08/2023 8:22:43 AM This report has been signed electronically. Number of Addenda: 0

## 2023-02-08 NOTE — Anesthesia Postprocedure Evaluation (Signed)
Anesthesia Post Note  Patient: Timothy Tyler.  Procedure(s) Performed: ESOPHAGOGASTRODUODENOSCOPY (EGD) WITH PROPOFOL COLONOSCOPY WITH PROPOFOL BIOPSY POLYPECTOMY  Patient location during evaluation: Phase II Anesthesia Type: General Level of consciousness: awake and alert and oriented Pain management: pain level controlled Vital Signs Assessment: post-procedure vital signs reviewed and stable Respiratory status: spontaneous breathing, nonlabored ventilation and respiratory function stable Cardiovascular status: blood pressure returned to baseline and stable Postop Assessment: no apparent nausea or vomiting Anesthetic complications: no  No notable events documented.   Last Vitals:  Vitals:   02/08/23 0636 02/08/23 0821  BP: 139/80 108/66  Pulse: 94 74  Resp: 17 20  Temp: 37 C 36.4 C  SpO2: 99% 98%    Last Pain:  Vitals:   02/08/23 0821  TempSrc: Axillary  PainSc: 0-No pain                 Dorine Duffey C Xiong Haidar

## 2023-02-08 NOTE — Op Note (Signed)
[facility] Patient Name: Timothy Tyler Procedure Date: 02/08/2023 7:10 AM MRN: [id number] Date of Birth: 02/03/1966 Attending MD: Katrinka Blazing , , [id number] CSN: [id number] Age: 57 Admit Type: Outpatient Procedure:                Upper GI endoscopy Indications:              Melena Providers:                Katrinka Blazing, Edrick Kins, RN, Elinor Parkinson, Dyann Ruddle Referring MD:              Medicines:                Monitored Anesthesia Care Complications:            No immediate complications. Estimated Blood Loss:     Estimated blood loss: none. Procedure:                Pre-Anesthesia Assessment:                           - Prior to the procedure, a History and Physical                            was performed, and patient medications, allergies                            and sensitivities were reviewed. The patient's                            tolerance of previous anesthesia was reviewed.                           - The risks and benefits of the procedure and the                            sedation options and risks were discussed with the                            patient. All questions were answered and informed                            consent was obtained.                           - ASA Grade Assessment: II - A patient with mild                            systemic disease.                           After obtaining informed consent, the endoscope was                            passed under direct vision. Throughout the  procedure, the patient's blood pressure, pulse, and                            oxygen saturations were monitored continuously. The                            GIF-H190 ([id number]) scope was introduced through the                            mouth, and advanced to the second part of duodenum.                            The upper GI endoscopy was accomplished without                             difficulty. The patient tolerated the procedure                            well. Scope In: 7:42:47 AM Scope Out: 7:47:13 AM Total Procedure Duration: 0 hours 4 minutes 26 seconds  Findings:      The examined esophagus was normal.      The gastroesophageal flap valve was visualized endoscopically and       classified as Hill Grade II (fold present, opens with respiration).      Patchy mildly erythematous mucosa was found in the gastric antrum.       Biopsies were taken with a cold forceps for Helicobacter pylori testing.      The examined duodenum was normal. Impression:               - Normal esophagus.                           - Erythematous mucosa in the antrum. Biopsied.                           - Normal examined duodenum. Moderate Sedation:      Per Anesthesia Care Recommendation:           - Discharge patient to home (ambulatory).                           - Resume previous diet.                           - Await pathology results. Procedure Code(s):        --- Professional ---                           [phone], Esophagogastroduodenoscopy, flexible,                            transoral; with biopsy, single or multiple Diagnosis Code(s):        --- Professional ---                           K31.89, Other diseases of stomach and duodenum  K92.1, Melena (includes Hematochezia) CPT copyright 2022 American Medical Association. All rights reserved. The codes documented in this report are preliminary and upon coder review may  be revised to meet current compliance requirements. Katrinka Blazing, MD Katrinka Blazing,  02/08/2023 7:51:55 AM This report has been signed electronically. Number of Addenda: 0

## 2023-02-09 ENCOUNTER — Encounter (INDEPENDENT_AMBULATORY_CARE_PROVIDER_SITE_OTHER): Payer: Self-pay | Admitting: *Deleted

## 2023-02-10 ENCOUNTER — Encounter (HOSPITAL_COMMUNITY): Payer: Self-pay | Admitting: Gastroenterology

## 2023-03-02 DIAGNOSIS — H16223 Keratoconjunctivitis sicca, not specified as Sjogren's, bilateral: Secondary | ICD-10-CM | POA: Diagnosis not present

## 2023-03-02 DIAGNOSIS — H2512 Age-related nuclear cataract, left eye: Secondary | ICD-10-CM | POA: Diagnosis not present

## 2023-03-02 DIAGNOSIS — H25041 Posterior subcapsular polar age-related cataract, right eye: Secondary | ICD-10-CM | POA: Diagnosis not present

## 2023-03-16 DIAGNOSIS — H25043 Posterior subcapsular polar age-related cataract, bilateral: Secondary | ICD-10-CM | POA: Diagnosis not present

## 2023-04-06 NOTE — H&P (Signed)
Surgical History & Physical  Patient Name: Timothy Tyler  DOB: Sep 26, 1965  Surgery: Cataract extraction with intraocular lens implant phacoemulsification; Right Eye Surgeon: Pecolia Ades MD Surgery Date: 04/15/2023 Pre-Op Date: 03/16/2023  HPI: A 33 Yr. old male patient present for cataract eval per Dr. Daphine Deutscher. 1. The patient complains of difficulty when driving, watching TV, reading, all activities of daily living which began for an unknown amount of time. The right eye is affected. The episode is constant. The condition's severity is worsening. This is negatively affecting the patient's quality of life and the patient is unable to function adequately in life with the current level of vision.  Medical History: Dry Eyes Cataracts  Epilepsy, acid reflux Heart Problem High Blood Pressure LDL  Review of Systems Cardiovascular High Blood Pressure Gastrointestinal acid reflux Neurological epilepsy All recorded systems are negative except as noted above.  Social Never smoked    Medication meloxicam ,  benazepril-hydrochlorothiazide ,  peg-electrolyte soln ,  omeprazole ,  atorvastatin ,  carbamazepine ,  tamsulosin ,  gemfibrozil ,  metoprolol tartrate ,  cyclobenzaprine   Sx/Procedures Gallbladder Sx  Drug Allergies  NKDA  History & Physical: Heent: cataracts NECK: supple without bruits LUNGS: lungs clear to auscultation CV: regular rate and rhythm Abdomen: soft and non-tender  Impression & Plan: Assessment: 1.  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  Cataracts are visually significant and account for the patient's complaints. Discussed all risks, benefits, procedures and recovery, including infection, loss of vision and eye, need for glasses after surgery or additional procedures. Patient understands changing glasses will not improve vision. Patient indicated understanding of procedure. All questions answered.  Patient desires to have surgery, recommend phacoemulsification with intraocular lens. Patient to have preliminary testing necessary (Argos/IOL Master, Mac OCT, TOPO) Educational materials provided:Cataract. RECOMMEND STANDARD VS EYEHANCE VS TORIC VS VIVTY  Plan: - Proceed with surgery OD followed by OS (OS for anisometropia) - Plan for best distance target OU - DIB00 lens - Flomax use - No DM, no prior eye surgery, no fuchs  2.  Per Dr. Daphine Deutscher

## 2023-04-08 DIAGNOSIS — H2511 Age-related nuclear cataract, right eye: Secondary | ICD-10-CM | POA: Diagnosis not present

## 2023-04-11 ENCOUNTER — Ambulatory Visit (INDEPENDENT_AMBULATORY_CARE_PROVIDER_SITE_OTHER): Payer: 59 | Admitting: Gastroenterology

## 2023-04-11 ENCOUNTER — Encounter (HOSPITAL_COMMUNITY)
Admission: RE | Admit: 2023-04-11 | Discharge: 2023-04-11 | Disposition: A | Discharge status: Home / Self Care | Payer: Medicare PPO | Source: Ambulatory Visit | Attending: Optometry | Admitting: Optometry

## 2023-04-11 ENCOUNTER — Encounter (HOSPITAL_COMMUNITY): Payer: Self-pay

## 2023-04-15 ENCOUNTER — Ambulatory Visit (HOSPITAL_COMMUNITY): Payer: 59 | Admitting: Certified Registered Nurse Anesthetist

## 2023-04-15 ENCOUNTER — Ambulatory Visit (HOSPITAL_COMMUNITY)
Admission: RE | Admit: 2023-04-15 | Discharge: 2023-04-15 | Disposition: A | Discharge status: Home / Self Care | Payer: 59 | Attending: Optometry | Admitting: Optometry

## 2023-04-15 ENCOUNTER — Encounter (HOSPITAL_COMMUNITY): Payer: Self-pay | Admitting: Optometry

## 2023-04-15 ENCOUNTER — Encounter (HOSPITAL_COMMUNITY)
Admission: RE | Disposition: A | Discharge status: Home / Self Care | Payer: Self-pay | Source: Home / Self Care | Attending: Optometry

## 2023-04-15 DIAGNOSIS — K219 Gastro-esophageal reflux disease without esophagitis: Secondary | ICD-10-CM | POA: Insufficient documentation

## 2023-04-15 DIAGNOSIS — H25811 Combined forms of age-related cataract, right eye: Secondary | ICD-10-CM

## 2023-04-15 DIAGNOSIS — Z87891 Personal history of nicotine dependence: Secondary | ICD-10-CM | POA: Insufficient documentation

## 2023-04-15 DIAGNOSIS — I10 Essential (primary) hypertension: Secondary | ICD-10-CM | POA: Diagnosis not present

## 2023-04-15 DIAGNOSIS — H5203 Hypermetropia, bilateral: Secondary | ICD-10-CM | POA: Diagnosis not present

## 2023-04-15 DIAGNOSIS — G40909 Epilepsy, unspecified, not intractable, without status epilepticus: Secondary | ICD-10-CM | POA: Insufficient documentation

## 2023-04-15 DIAGNOSIS — H2511 Age-related nuclear cataract, right eye: Secondary | ICD-10-CM | POA: Insufficient documentation

## 2023-04-15 DIAGNOSIS — R569 Unspecified convulsions: Secondary | ICD-10-CM | POA: Diagnosis not present

## 2023-04-15 HISTORY — PX: CATARACT EXTRACTION W/PHACO: SHX586

## 2023-04-15 SURGERY — PHACOEMULSIFICATION, CATARACT, WITH IOL INSERTION
Anesthesia: Monitor Anesthesia Care | Site: Eye | Laterality: Right

## 2023-04-15 MED ORDER — FENTANYL CITRATE (PF) 100 MCG/2ML IJ SOLN
INTRAMUSCULAR | Status: AC
  Filled 2023-04-15: qty 2

## 2023-04-15 MED ORDER — MIDAZOLAM HCL 5 MG/5ML IJ SOLN
INTRAMUSCULAR | Status: DC | PRN
  Administered 2023-04-15: 1 mg via INTRAVENOUS

## 2023-04-15 MED ORDER — MOXIFLOXACIN HCL 5 MG/ML IO SOLN
INTRAOCULAR | Status: DC | PRN
  Administered 2023-04-15: .2 mL via INTRACAMERAL

## 2023-04-15 MED ORDER — LIDOCAINE HCL (PF) 1 % IJ SOLN
INTRAMUSCULAR | Status: DC | PRN
  Administered 2023-04-15: 2 mL

## 2023-04-15 MED ORDER — BSS IO SOLN
INTRAOCULAR | Status: DC | PRN
  Administered 2023-04-15: 15 mL via INTRAOCULAR

## 2023-04-15 MED ORDER — PHENYLEPHRINE-KETOROLAC 1-0.3 % IO SOLN
INTRAOCULAR | Status: DC | PRN
  Administered 2023-04-15: 500 mL via OPHTHALMIC

## 2023-04-15 MED ORDER — PHENYLEPHRINE HCL 2.5 % OP SOLN
1.0000 [drp] | OPHTHALMIC | Status: AC
  Administered 2023-04-15 (×3): 1 [drp] via OPHTHALMIC

## 2023-04-15 MED ORDER — TETRACAINE HCL 0.5 % OP SOLN
1.0000 [drp] | OPHTHALMIC | Status: AC
  Administered 2023-04-15 (×3): 1 [drp] via OPHTHALMIC

## 2023-04-15 MED ORDER — SIGHTPATH DOSE#1 NA HYALUR & NA CHOND-NA HYALUR IO KIT
PACK | INTRAOCULAR | Status: DC | PRN
  Administered 2023-04-15: 1 via OPHTHALMIC

## 2023-04-15 MED ORDER — LIDOCAINE HCL 3.5 % OP GEL
1.0000 | Freq: Once | OPHTHALMIC | Status: AC
  Administered 2023-04-15: 1 via OPHTHALMIC

## 2023-04-15 MED ORDER — MIDAZOLAM HCL 2 MG/2ML IJ SOLN
INTRAMUSCULAR | Status: AC
  Filled 2023-04-15: qty 2

## 2023-04-15 MED ORDER — SODIUM CHLORIDE 0.9% FLUSH
INTRAVENOUS | Status: DC | PRN
Start: 2023-04-15 — End: 2023-04-15
  Administered 2023-04-15: 5 mL via INTRAVENOUS

## 2023-04-15 MED ORDER — MOXIFLOXACIN HCL 5 MG/ML IO SOLN
INTRAOCULAR | Status: AC
  Filled 2023-04-15: qty 1

## 2023-04-15 MED ORDER — FENTANYL CITRATE (PF) 100 MCG/2ML IJ SOLN
INTRAMUSCULAR | Status: DC | PRN
  Administered 2023-04-15: 50 ug via INTRAVENOUS

## 2023-04-15 MED ORDER — STERILE WATER FOR IRRIGATION IR SOLN
Status: DC | PRN
  Administered 2023-04-15: 250 mL

## 2023-04-15 MED ORDER — POVIDONE-IODINE 5 % OP SOLN
OPHTHALMIC | Status: DC | PRN
  Administered 2023-04-15: 1 via OPHTHALMIC

## 2023-04-15 MED ORDER — PHENYLEPHRINE-KETOROLAC 1-0.3 % IO SOLN
INTRAOCULAR | Status: AC
  Filled 2023-04-15: qty 4

## 2023-04-15 MED ORDER — TROPICAMIDE 1 % OP SOLN
1.0000 [drp] | OPHTHALMIC | Status: AC
  Administered 2023-04-15 (×3): 1 [drp] via OPHTHALMIC

## 2023-04-15 SURGICAL SUPPLY — 15 items
CATARACT SUITE SIGHTPATH (MISCELLANEOUS) ×1
CLOTH BEACON ORANGE TIMEOUT ST (SAFETY) ×1 IMPLANT
DRSG TEGADERM 4X4.75 (GAUZE/BANDAGES/DRESSINGS) ×1 IMPLANT
EYE SHIELD UNIVERSAL CLEAR (GAUZE/BANDAGES/DRESSINGS) IMPLANT
FEE CATARACT SUITE SIGHTPATH (MISCELLANEOUS) ×1 IMPLANT
GLOVE BIOGEL PI IND STRL 7.0 (GLOVE) ×2 IMPLANT
LENS IOL TECNIS EYHANCE 23.5 (Intraocular Lens) IMPLANT
NDL HYPO 18GX1.5 BLUNT FILL (NEEDLE) ×1 IMPLANT
NEEDLE HYPO 18GX1.5 BLUNT FILL (NEEDLE) ×1
PAD ARMBOARD 7.5X6 YLW CONV (MISCELLANEOUS) ×1 IMPLANT
POSITIONER HEAD 8X9X4 ADT (SOFTGOODS) ×1 IMPLANT
RING MALYGIN 7.0 (MISCELLANEOUS) IMPLANT
SYR TB 1ML LL NO SAFETY (SYRINGE) ×1 IMPLANT
TAPE SURG TRANSPORE 1 IN (GAUZE/BANDAGES/DRESSINGS) IMPLANT
WATER STERILE IRR 250ML POUR (IV SOLUTION) ×1 IMPLANT

## 2023-04-15 NOTE — Discharge Instructions (Signed)
Please discharge patient when stable, will follow up today with Dr. Tod Abrahamsen at the [facility] [place] office immediately following discharge.  Leave shield in place until visit.  All paperwork with discharge instructions will be given at the office.  [facility] Address:  [address]  [place], [place] [address]  Dr. Feliciano Wynter's Phone: [phone]  

## 2023-04-15 NOTE — Interval H&P Note (Signed)
History and Physical Interval Note:  04/15/2023 7:14 AM  The H and P was reviewed and updated. The patient was examined.  No changes were found after exam.  The surgical eye was marked.  Jean Alejos

## 2023-04-15 NOTE — Anesthesia Preprocedure Evaluation (Signed)
Anesthesia Evaluation  Patient identified by MRN, date of birth, ID band Patient awake    Reviewed: Allergy & Precautions, H&P , NPO status , Patient's Chart, lab work & pertinent test results, reviewed documented beta blocker date and time   Airway Mallampati: II  TM Distance: >3 FB Neck ROM: full    Dental no notable dental hx.    Pulmonary neg pulmonary ROS, shortness of breath, former smoker   Pulmonary exam normal breath sounds clear to auscultation       Cardiovascular Exercise Tolerance: Good hypertension, negative cardio ROS  Rhythm:regular Rate:Normal     Neuro/Psych Seizures -,  negative neurological ROS  negative psych ROS   GI/Hepatic negative GI ROS, Neg liver ROS,GERD  ,,  Endo/Other  negative endocrine ROS    Renal/GU negative Renal ROS  negative genitourinary   Musculoskeletal   Abdominal   Peds  Hematology negative hematology ROS (+)   Anesthesia Other Findings   Reproductive/Obstetrics negative OB ROS                             Anesthesia Physical Anesthesia Plan  ASA: 2  Anesthesia Plan: General and MAC   Post-op Pain Management:    Induction:   PONV Risk Score and Plan:   Airway Management Planned:   Additional Equipment:   Intra-op Plan:   Post-operative Plan:   Informed Consent: I have reviewed the patients History and Physical, chart, labs and discussed the procedure including the risks, benefits and alternatives for the proposed anesthesia with the patient or authorized representative who has indicated his/her understanding and acceptance.     Dental Advisory Given  Plan Discussed with: CRNA  Anesthesia Plan Comments:        Anesthesia Quick Evaluation

## 2023-04-15 NOTE — Anesthesia Postprocedure Evaluation (Signed)
Anesthesia Post Note  Patient: [name] [name].  Procedure(s) Performed: CATARACT EXTRACTION PHACO AND INTRAOCULAR LENS PLACEMENT (IOC) (Right: Eye)  Patient location during evaluation: Phase II Anesthesia Type: MAC Level of consciousness: awake Pain management: pain level controlled Vital Signs Assessment: post-procedure vital signs reviewed and stable Respiratory status: spontaneous breathing and respiratory function stable Cardiovascular status: blood pressure returned to baseline and stable Postop Assessment: no headache and no apparent nausea or vomiting Anesthetic complications: no Comments: Late entry   No notable events documented.   Last Vitals:  Vitals:   04/15/23 0640 04/15/23 0753  BP: 128/78 115/76  Pulse: 66 68  Resp: 16 13  Temp: 36.4 C 36.6 C  SpO2: 96% 99%    Last Pain:  Vitals:   04/15/23 0753  TempSrc: Oral  PainSc: 0-No pain                 Windell Norfolk

## 2023-04-15 NOTE — Op Note (Signed)
Date of procedure: 04/15/23  Pre-operative diagnosis: Visually significant age-related nuclear cataract, Right Eye (H25.11)  Post-operative diagnosis: Visually significant age-related nuclear cataract, Right Eye  Procedure: Removal of cataract via phacoemulsification and insertion of intra-ocular lens J&J DIBOO +23.5D into the capsular bag of the Right Eye  Attending surgeon: Pecolia Ades, MD  Anesthesia: MAC, Topical Akten  Complications: None  Estimated Blood Loss: <78mL (minimal)  Specimens: None  Implants:  Implant Name Type Inv. Item Serial No. Manufacturer Lot No. LRB No. Used Action  LENS IOL TECNIS EYHANCE 23.5 - [id number] Intraocular Lens LENS IOL TECNIS EYHANCE 23.5 [id number] SIGHTPATH  Right 1 Implanted    Indications:  Visually significant age-related cataract, Right Eye  Procedure:  The patient was seen and identified in the pre-operative area. The operative eye was identified and dilated.  The operative eye was marked.  Topical anesthesia was administered to the operative eye.     The patient was then to the operative suite and placed in the supine position.  A timeout was performed confirming the patient, procedure to be performed, and all other relevant information.   The patient's face was prepped and draped in the usual fashion for intra-ocular surgery.  A lid speculum was placed into the operative eye and the surgical microscope moved into place and focused.  A superotemporal paracentesis was created using a 20 gauge paracentesis blade.  BSS mixed with Omidria, followed by 1% lidocaine was injected into the anterior chamber.  Viscoelastic was injected into the anterior chamber.  A temporal clear-corneal main wound incision was created using a 2.34mm microkeratome.  A continuous curvilinear capsulorrhexis was initiated using an irrigating cystitome and completed using capsulorrhexis forceps.  Hydrodissection and hydrodeliniation were performed.  Viscoelastic was  injected into the anterior chamber.  A phacoemulsification handpiece and a chopper as a second instrument were used to remove the nucleus and epinucleus. The irrigation/aspiration handpiece was used to remove any remaining cortical material.   The capsular bag was reinflated with viscoelastic, checked, and found to be intact.  The intraocular lens was inserted into the capsular bag.  The irrigation/aspiration handpiece was used to remove any remaining viscoelastic.  The clear corneal wound and paracentesis wounds were then hydrated and checked with Weck-Cels to be watertight. Moxifloxacin was instilled into the anterior chamber.  The lid-speculum and drape was removed, and the patient's face was cleaned with a wet and dry 4x4. A clear shield was taped over the eye. The patient was taken to the post-operative care unit in good condition, having tolerated the procedure well.  Post-Op Instructions: The patient will follow up at [facility] for a same day post-operative evaluation and will receive all other orders and instructions.

## 2023-04-15 NOTE — Transfer of Care (Signed)
Immediate Anesthesia Transfer of Care Note  Patient: Timothy Tyler.  Procedure(s) Performed: CATARACT EXTRACTION PHACO AND INTRAOCULAR LENS PLACEMENT (IOC) (Right: Eye)  Patient Location: Short Stay  Anesthesia Type:MAC  Level of Consciousness: awake, alert , and oriented  Airway & Oxygen Therapy: Patient Spontanous Breathing  Post-op Assessment: Report given to RN, Post -op Vital signs reviewed and stable, Patient moving all extremities X 4, and Patient able to stick tongue midline  Post vital signs: Reviewed and stable  Last Vitals:  Vitals Value Taken Time  BP 115/76   Temp 97.9   Pulse 74   Resp 13   SpO2 98     Last Pain:  Vitals:   04/15/23 0640  TempSrc: Oral  PainSc:       Patients Stated Pain Goal: 5 (04/15/23 4098)  Complications: No notable events documented.

## 2023-04-19 ENCOUNTER — Encounter (HOSPITAL_COMMUNITY): Payer: Self-pay | Admitting: Optometry

## 2023-04-22 DIAGNOSIS — H2512 Age-related nuclear cataract, left eye: Secondary | ICD-10-CM | POA: Diagnosis not present

## 2023-04-25 NOTE — H&P (Signed)
Surgical History & Physical  Patient Name: Timothy Tyler  DOB: Jul 16, 1965  Surgery: Cataract extraction with intraocular lens implant phacoemulsification; Left Eye Surgeon: Pecolia Ades MD Surgery Date: 04/29/2023 Pre-Op Date: 04/19/2023  HPI: A 59 Yr. old male patient 1. The patient is returning for a cataract follow-up of the right eye. Since the last visit, the affected area is doing well. The patient's vision is improved. The condition's severity is constant. Patient is following medication instructions. Difficulties reading fine print, seeing captions on TV, glare on bright sunny days due to the cataract OS. This is negatively affecting the patient's quality of life and the patient is unable to function adequately in life with the current level of vision. Pt. is ready to proceed with surgery on OS, due to having blurred vision. HPI was performed by Pecolia Ades .  Medical History: Dry Eyes Cataracts  Epilepsy, acid reflux Heart Problem High Blood Pressure LDL  Review of Systems Negative Allergic/Immunologic Heart problems Cardiovascular Negative Constitutional Negative Ear, Nose, Mouth & Throat Negative Endocrine Negative Eyes Acid Reflux Gastrointestinal Negative Genitourinary Negative Hemotologic/Lymphatic Negative Integumentary Negative Musculoskeletal Epilepsy Neurological Negative Psychiatry Negative Respiratory  Social Never smoked   Medication Ciprofloxacin hcl, Prednisolone acetate,  meloxicam ,  benazepril-hydrochlorothiazide ,  peg-electrolyte soln ,  omeprazole ,  atorvastatin ,  carbamazepine ,  tamsulosin ,  gemfibrozil ,  metoprolol tartrate ,  cyclobenzaprine   Sx/Procedures Phaco c IOL OD,  Gallbladder Sx  Drug Allergies  NKDA  History & Physical: Heent: cataract OS NECK: supple without bruits LUNGS: lungs clear to auscultation CV: regular rate and rhythm Abdomen: soft and non-tender  Impression & Plan: Assessment: 1.  CATARACT  EXTRACTION STATUS; Right Eye (Z98.41) 2.  INTRAOCULAR LENS IOL ; Right Eye (Z96.1) 3.  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  POD4 exam Doing well. All post-op precautions discussed and instructions reviewed. Written instructions given.  2.   3.  Cataracts are visually significant and account for the patient's complaints. Discussed all risks, benefits, procedures and recovery, including infection, loss of vision and eye, need for glasses after surgery or additional procedures. Patient understands changing glasses will not improve vision. Patient indicated understanding of procedure. All questions answered. Patient desires to have surgery, recommend phacoemulsification with intraocular lens. Patient to have preliminary testing necessary (Argos/IOL Master, Mac OCT, TOPO) Educational materials provided:Cataract. RECOMMEND STANDARD VS EYEHANCE VS TORIC VS VIVTY  Plan: - Proceed with surgery OS when ready - Plan for best distance target OU - DIB00 lens - Flomax use - No DM, no prior eye surgery, no fuchs

## 2023-04-26 ENCOUNTER — Encounter (HOSPITAL_COMMUNITY): Payer: Self-pay

## 2023-04-26 ENCOUNTER — Encounter (HOSPITAL_COMMUNITY)
Admission: RE | Admit: 2023-04-26 | Discharge: 2023-04-26 | Disposition: A | Discharge status: Home / Self Care | Payer: 59 | Source: Ambulatory Visit | Attending: Optometry | Admitting: Optometry

## 2023-04-29 ENCOUNTER — Ambulatory Visit (HOSPITAL_COMMUNITY)
Admission: RE | Admit: 2023-04-29 | Discharge: 2023-04-29 | Disposition: A | Discharge status: Home / Self Care | Payer: 59 | Attending: Optometry | Admitting: Optometry

## 2023-04-29 ENCOUNTER — Encounter (HOSPITAL_COMMUNITY)
Admission: RE | Disposition: A | Discharge status: Home / Self Care | Payer: Self-pay | Source: Home / Self Care | Attending: Optometry

## 2023-04-29 ENCOUNTER — Encounter (HOSPITAL_COMMUNITY): Payer: Self-pay | Admitting: Optometry

## 2023-04-29 ENCOUNTER — Ambulatory Visit (HOSPITAL_COMMUNITY): Payer: 59 | Admitting: Anesthesiology

## 2023-04-29 DIAGNOSIS — H2512 Age-related nuclear cataract, left eye: Secondary | ICD-10-CM

## 2023-04-29 DIAGNOSIS — Z87891 Personal history of nicotine dependence: Secondary | ICD-10-CM | POA: Diagnosis not present

## 2023-04-29 DIAGNOSIS — I10 Essential (primary) hypertension: Secondary | ICD-10-CM | POA: Diagnosis not present

## 2023-04-29 DIAGNOSIS — R569 Unspecified convulsions: Secondary | ICD-10-CM | POA: Diagnosis not present

## 2023-04-29 DIAGNOSIS — G40909 Epilepsy, unspecified, not intractable, without status epilepticus: Secondary | ICD-10-CM | POA: Insufficient documentation

## 2023-04-29 DIAGNOSIS — K219 Gastro-esophageal reflux disease without esophagitis: Secondary | ICD-10-CM | POA: Insufficient documentation

## 2023-04-29 HISTORY — PX: CATARACT EXTRACTION W/PHACO: SHX586

## 2023-04-29 SURGERY — PHACOEMULSIFICATION, CATARACT, WITH IOL INSERTION
Anesthesia: Monitor Anesthesia Care | Site: Eye | Laterality: Left

## 2023-04-29 MED ORDER — MOXIFLOXACIN HCL 5 MG/ML IO SOLN
INTRAOCULAR | Status: DC | PRN
  Administered 2023-04-29: .2 mL via INTRACAMERAL

## 2023-04-29 MED ORDER — PHENYLEPHRINE-KETOROLAC 1-0.3 % IO SOLN
INTRAOCULAR | Status: AC
  Filled 2023-04-29: qty 4

## 2023-04-29 MED ORDER — PHENYLEPHRINE HCL 2.5 % OP SOLN
1.0000 [drp] | OPHTHALMIC | Status: AC | PRN
  Administered 2023-04-29 (×3): 1 [drp] via OPHTHALMIC

## 2023-04-29 MED ORDER — FENTANYL CITRATE (PF) 100 MCG/2ML IJ SOLN
INTRAMUSCULAR | Status: AC
  Filled 2023-04-29: qty 2

## 2023-04-29 MED ORDER — LACTATED RINGERS IV SOLN: INTRAVENOUS | Status: DC

## 2023-04-29 MED ORDER — STERILE WATER FOR IRRIGATION IR SOLN
Status: DC | PRN
  Administered 2023-04-29: 250 mL

## 2023-04-29 MED ORDER — MIDAZOLAM HCL 2 MG/2ML IJ SOLN
INTRAMUSCULAR | Status: DC | PRN
  Administered 2023-04-29: 1 mg via INTRAVENOUS

## 2023-04-29 MED ORDER — MIDAZOLAM HCL 2 MG/2ML IJ SOLN
INTRAMUSCULAR | Status: AC
  Filled 2023-04-29: qty 2

## 2023-04-29 MED ORDER — LIDOCAINE HCL (PF) 1 % IJ SOLN
INTRAMUSCULAR | Status: DC | PRN
  Administered 2023-04-29: 2 mL

## 2023-04-29 MED ORDER — POVIDONE-IODINE 5 % OP SOLN
OPHTHALMIC | Status: DC | PRN
  Administered 2023-04-29: 1 via OPHTHALMIC

## 2023-04-29 MED ORDER — LIDOCAINE HCL 3.5 % OP GEL
1.0000 | Freq: Once | OPHTHALMIC | Status: AC
  Administered 2023-04-29: 1 via OPHTHALMIC

## 2023-04-29 MED ORDER — TETRACAINE HCL 0.5 % OP SOLN
1.0000 [drp] | OPHTHALMIC | Status: AC | PRN
  Administered 2023-04-29 (×3): 1 [drp] via OPHTHALMIC

## 2023-04-29 MED ORDER — PHENYLEPHRINE-KETOROLAC 1-0.3 % IO SOLN
INTRAOCULAR | Status: DC | PRN
  Administered 2023-04-29: 500 mL via OPHTHALMIC

## 2023-04-29 MED ORDER — SODIUM CHLORIDE 0.9% FLUSH
INTRAVENOUS | Status: DC | PRN
  Administered 2023-04-29: 5 mL via INTRAVENOUS

## 2023-04-29 MED ORDER — SIGHTPATH DOSE#1 NA HYALUR & NA CHOND-NA HYALUR IO KIT
PACK | INTRAOCULAR | Status: DC | PRN
  Administered 2023-04-29: 1 via OPHTHALMIC

## 2023-04-29 MED ORDER — BSS IO SOLN
INTRAOCULAR | Status: DC | PRN
  Administered 2023-04-29: 15 mL via INTRAOCULAR

## 2023-04-29 MED ORDER — TROPICAMIDE 1 % OP SOLN
1.0000 [drp] | OPHTHALMIC | Status: AC | PRN
  Administered 2023-04-29 (×3): 1 [drp] via OPHTHALMIC

## 2023-04-29 MED ORDER — FENTANYL CITRATE (PF) 100 MCG/2ML IJ SOLN
INTRAMUSCULAR | Status: DC | PRN
  Administered 2023-04-29: 50 ug via INTRAVENOUS

## 2023-04-29 SURGICAL SUPPLY — 15 items
CATARACT SUITE SIGHTPATH (MISCELLANEOUS) ×1
CLOTH BEACON ORANGE TIMEOUT ST (SAFETY) ×1 IMPLANT
DRSG TEGADERM 4X4.75 (GAUZE/BANDAGES/DRESSINGS) ×1 IMPLANT
EYE SHIELD UNIVERSAL CLEAR (GAUZE/BANDAGES/DRESSINGS) IMPLANT
FEE CATARACT SUITE SIGHTPATH (MISCELLANEOUS) ×1 IMPLANT
GLOVE BIOGEL PI IND STRL 7.0 (GLOVE) ×2 IMPLANT
LENS IOL TECNIS EYHANCE 23.0 (Intraocular Lens) IMPLANT
NDL HYPO 18GX1.5 BLUNT FILL (NEEDLE) ×1 IMPLANT
NEEDLE HYPO 18GX1.5 BLUNT FILL (NEEDLE) ×1
PAD ARMBOARD 7.5X6 YLW CONV (MISCELLANEOUS) ×1 IMPLANT
POSITIONER HEAD 8X9X4 ADT (SOFTGOODS) ×1 IMPLANT
RING MALYGIN 7.0 (MISCELLANEOUS) IMPLANT
SYR TB 1ML LL NO SAFETY (SYRINGE) ×1 IMPLANT
TAPE SURG TRANSPORE 1 IN (GAUZE/BANDAGES/DRESSINGS) IMPLANT
WATER STERILE IRR 250ML POUR (IV SOLUTION) ×1 IMPLANT

## 2023-04-29 NOTE — Interval H&P Note (Signed)
History and Physical Interval Note:  04/29/2023 9:21 AM  The H and P was reviewed and updated. The patient was examined.  No changes were found after exam.  The surgical eye was marked.  Timothy Tyler

## 2023-04-29 NOTE — Op Note (Signed)
Date of procedure: 04/29/23  Pre-operative diagnosis: Visually significant age-related nuclear cataract, Left Eye (H25.12)  Post-operative diagnosis: Visually significant age-related nuclear cataract, Left Eye  Procedure: Removal of cataract via phacoemulsification and insertion of intra-ocular lens J&J DIB00 +23.0D into the capsular bag of the Left Eye  Attending surgeon: Ronal Fear, MD  Anesthesia: MAC, Topical Akten  Complications: None  Estimated Blood Loss: <15mL (minimal)  Specimens: None  Implants:  Implant Name Type Inv. Item Serial No. Manufacturer Lot No. LRB No. Used Action  LENS IOL TECNIS EYHANCE 23.0 - [id number] Intraocular Lens LENS IOL TECNIS EYHANCE 23.0 [id number] SIGHTPATH  Left 1 Implanted    Indications:  Visually significant age-related cataract, Left Eye  Procedure:  The patient was seen and identified in the pre-operative area. The operative eye was identified and dilated.  The operative eye was marked.  Topical anesthesia was administered to the operative eye.     The patient was then to the operative suite and placed in the supine position.  A timeout was performed confirming the patient, procedure to be performed, and all other relevant information.   The patient's face was prepped and draped in the usual fashion for intra-ocular surgery.  A lid speculum was placed into the operative eye and the surgical microscope moved into place and focused.  An inferotemporal paracentesis was created using a 20 gauge paracentesis blade.  BSS mixed with Omidria, followed by 1% lidocaine was injected into the anterior chamber.  Viscoelastic was injected into the anterior chamber.  A temporal clear-corneal main wound incision was created using a 2.20mm microkeratome.  A continuous curvilinear capsulorrhexis was initiated using an irrigating cystitome and completed using capsulorrhexis forceps.  Hydrodissection and hydrodeliniation were performed.  Viscoelastic was  injected into the anterior chamber.  A phacoemulsification handpiece and a chopper as a second instrument were used to remove the nucleus and epinucleus. The irrigation/aspiration handpiece was used to remove any remaining cortical material.   The capsular bag was reinflated with viscoelastic, checked, and found to be intact.  The intraocular lens was inserted into the capsular bag.  The irrigation/aspiration handpiece was used to remove any remaining viscoelastic.  The clear corneal wound and paracentesis wounds were then hydrated and checked with Weck-Cels to be watertight. Moxifloxacin was instilled into the anterior chamber.  The lid-speculum and drape was removed, and the patient's face was cleaned with a wet and dry 4x4.  A clear shield was taped over the eye. The patient was taken to the post-operative care unit in good condition, having tolerated the procedure well.  Post-Op Instructions: The patient will follow up at [facility] for a same day post-operative evaluation and will receive all other orders and instructions.

## 2023-04-29 NOTE — Discharge Instructions (Signed)
Please discharge patient when stable, will follow up today with Dr. Tod Abrahamsen at the [facility] [place] office immediately following discharge.  Leave shield in place until visit.  All paperwork with discharge instructions will be given at the office.  [facility] Address:  [address]  [place], [place] [address]  Dr. Feliciano Wynter's Phone: [phone]  

## 2023-04-29 NOTE — Anesthesia Preprocedure Evaluation (Addendum)
Anesthesia Evaluation  Patient identified by MRN, date of birth, ID band Patient awake    Reviewed: Allergy & Precautions, H&P , NPO status , Patient's Chart, lab work & pertinent test results, reviewed documented beta blocker date and time   Airway Mallampati: II  TM Distance: >3 FB Neck ROM: full    Dental  (+) Dental Advisory Given, Edentulous Upper, Partial Lower   Pulmonary shortness of breath, former smoker Stop Bang 3   Pulmonary exam normal breath sounds clear to auscultation       Cardiovascular Exercise Tolerance: Good hypertension, Normal cardiovascular exam Rhythm:regular Rate:Normal  Irregular hear beat   Neuro/Psych Seizures -,  negative neurological ROS  negative psych ROS   GI/Hepatic negative GI ROS, Neg liver ROS,GERD  ,,  Endo/Other  negative endocrine ROS    Renal/GU negative Renal ROS  negative genitourinary   Musculoskeletal   Abdominal Normal abdominal exam  (+)   Peds  Hematology negative hematology ROS (+)   Anesthesia Other Findings   Reproductive/Obstetrics negative OB ROS                             Anesthesia Physical Anesthesia Plan  ASA: 2  Anesthesia Plan: General and MAC   Post-op Pain Management: Minimal or no pain anticipated   Induction: Intravenous  PONV Risk Score and Plan:   Airway Management Planned: Nasal Cannula and Natural Airway  Additional Equipment: None  Intra-op Plan:   Post-operative Plan:   Informed Consent: I have reviewed the patients History and Physical, chart, labs and discussed the procedure including the risks, benefits and alternatives for the proposed anesthesia with the patient or authorized representative who has indicated his/her understanding and acceptance.     Dental Advisory Given  Plan Discussed with: CRNA  Anesthesia Plan Comments:        Anesthesia Quick Evaluation

## 2023-04-29 NOTE — Anesthesia Postprocedure Evaluation (Signed)
Anesthesia Post Note  Patient: Timothy Tyler.  Procedure(s) Performed: CATARACT EXTRACTION PHACO AND INTRAOCULAR LENS PLACEMENT (IOC) (Left: Eye)  Patient location during evaluation: PACU Anesthesia Type: MAC Level of consciousness: awake and alert Pain management: pain level controlled Vital Signs Assessment: post-procedure vital signs reviewed and stable Respiratory status: spontaneous breathing, nonlabored ventilation, respiratory function stable and patient connected to nasal cannula oxygen Cardiovascular status: stable and blood pressure returned to baseline Postop Assessment: no apparent nausea or vomiting Anesthetic complications: no   There were no known notable events for this encounter.   Last Vitals:  Vitals:   04/29/23 0917 04/29/23 1030  BP: 132/75 136/89  Pulse: 66 62  Resp: 14 12  Temp: (!) 36.4 C 36.8 C  SpO2: 99% 99%    Last Pain:  Vitals:   04/29/23 0913  PainSc: 0-No pain                 Deshea Pooley L Navika Hoopes

## 2023-04-29 NOTE — Transfer of Care (Addendum)
Immediate Anesthesia Transfer of Care Note  Patient: Timothy Tyler.  Procedure(s) Performed: CATARACT EXTRACTION PHACO AND INTRAOCULAR LENS PLACEMENT (IOC) (Left: Eye)  Patient Location: Short Stay  Anesthesia Type:MAC  Level of Consciousness: awake and patient cooperative  Airway & Oxygen Therapy: Patient Spontanous Breathing  Post-op Assessment: Report given to RN and Post -op Vital signs reviewed and stable  Post vital signs: Reviewed and stable  Last Vitals:  Vitals Value Taken Time  BP 136/89 04/29/23   1030  Temp 36.8 04/29/23   1030  Pulse 62 04/29/23   1030  Resp 12 04/29/23   1030  SpO2 99% 04/29/23   1030    Last Pain:  Vitals:   04/29/23 0913  PainSc: 0-No pain         Complications: No notable events documented.

## 2023-05-04 ENCOUNTER — Encounter (HOSPITAL_COMMUNITY): Payer: Self-pay | Admitting: Optometry

## 2023-07-12 DIAGNOSIS — J069 Acute upper respiratory infection, unspecified: Secondary | ICD-10-CM | POA: Diagnosis not present

## 2023-07-12 DIAGNOSIS — R0981 Nasal congestion: Secondary | ICD-10-CM | POA: Diagnosis not present

## 2023-08-05 DIAGNOSIS — Z299 Encounter for prophylactic measures, unspecified: Secondary | ICD-10-CM | POA: Diagnosis not present

## 2023-08-05 DIAGNOSIS — I700 Atherosclerosis of aorta: Secondary | ICD-10-CM | POA: Diagnosis not present

## 2023-08-05 DIAGNOSIS — R0600 Dyspnea, unspecified: Secondary | ICD-10-CM | POA: Diagnosis not present

## 2023-08-05 DIAGNOSIS — R079 Chest pain, unspecified: Secondary | ICD-10-CM | POA: Diagnosis not present

## 2023-08-05 DIAGNOSIS — I10 Essential (primary) hypertension: Secondary | ICD-10-CM | POA: Diagnosis not present

## 2023-08-15 DIAGNOSIS — R0600 Dyspnea, unspecified: Secondary | ICD-10-CM | POA: Diagnosis not present

## 2023-09-29 DIAGNOSIS — Z0000 Encounter for general adult medical examination without abnormal findings: Secondary | ICD-10-CM | POA: Diagnosis not present

## 2023-09-29 DIAGNOSIS — Z7189 Other specified counseling: Secondary | ICD-10-CM | POA: Diagnosis not present

## 2023-09-29 DIAGNOSIS — I700 Atherosclerosis of aorta: Secondary | ICD-10-CM | POA: Diagnosis not present

## 2023-09-29 DIAGNOSIS — Z299 Encounter for prophylactic measures, unspecified: Secondary | ICD-10-CM | POA: Diagnosis not present

## 2023-09-29 DIAGNOSIS — I10 Essential (primary) hypertension: Secondary | ICD-10-CM | POA: Diagnosis not present

## 2024-01-13 DIAGNOSIS — I10 Essential (primary) hypertension: Secondary | ICD-10-CM | POA: Diagnosis not present

## 2024-01-13 DIAGNOSIS — Z299 Encounter for prophylactic measures, unspecified: Secondary | ICD-10-CM | POA: Diagnosis not present

## 2024-01-13 DIAGNOSIS — R5383 Other fatigue: Secondary | ICD-10-CM | POA: Diagnosis not present

## 2024-01-13 DIAGNOSIS — Z0000 Encounter for general adult medical examination without abnormal findings: Secondary | ICD-10-CM | POA: Diagnosis not present

## 2024-01-13 DIAGNOSIS — E7800 Pure hypercholesterolemia, unspecified: Secondary | ICD-10-CM | POA: Diagnosis not present

## 2024-01-13 DIAGNOSIS — Z79899 Other long term (current) drug therapy: Secondary | ICD-10-CM | POA: Diagnosis not present

## 2024-05-09 ENCOUNTER — Encounter (INDEPENDENT_AMBULATORY_CARE_PROVIDER_SITE_OTHER): Payer: Self-pay | Admitting: Gastroenterology
